# Patient Record
Sex: Male | Born: 1965
Health system: Southern US, Community
[De-identification: ages and names within clinical notes are randomized; demographics above are authoritative.]

## PROBLEM LIST (undated history)

## (undated) DIAGNOSIS — F329 Major depressive disorder, single episode, unspecified: Secondary | ICD-10-CM

## (undated) DIAGNOSIS — F419 Anxiety disorder, unspecified: Secondary | ICD-10-CM

## (undated) DIAGNOSIS — F909 Attention-deficit hyperactivity disorder, unspecified type: Secondary | ICD-10-CM

## (undated) DIAGNOSIS — F32A Depression, unspecified: Secondary | ICD-10-CM

## (undated) DIAGNOSIS — E785 Hyperlipidemia, unspecified: Secondary | ICD-10-CM

## (undated) DIAGNOSIS — B019 Varicella without complication: Secondary | ICD-10-CM

## (undated) HISTORY — DX: Attention-deficit hyperactivity disorder, unspecified type: F90.9

## (undated) HISTORY — DX: Anxiety disorder, unspecified: F41.9

## (undated) HISTORY — DX: Varicella without complication: B01.9

## (undated) HISTORY — PX: WISDOM TOOTH EXTRACTION: SHX21

## (undated) HISTORY — PX: OTHER SURGICAL HISTORY: SHX169

## (undated) HISTORY — DX: Hyperlipidemia, unspecified: E78.5

## (undated) HISTORY — DX: Major depressive disorder, single episode, unspecified: F32.9

## (undated) HISTORY — DX: Depression, unspecified: F32.A

---

## 2004-01-21 ENCOUNTER — Ambulatory Visit: Payer: Self-pay | Admitting: Family Medicine

## 2005-06-06 ENCOUNTER — Ambulatory Visit: Payer: Self-pay | Admitting: Family Medicine

## 2008-10-28 ENCOUNTER — Ambulatory Visit: Payer: Self-pay | Admitting: Family Medicine

## 2008-10-28 DIAGNOSIS — J209 Acute bronchitis, unspecified: Secondary | ICD-10-CM

## 2008-10-28 DIAGNOSIS — E785 Hyperlipidemia, unspecified: Secondary | ICD-10-CM | POA: Insufficient documentation

## 2008-10-28 DIAGNOSIS — F329 Major depressive disorder, single episode, unspecified: Secondary | ICD-10-CM

## 2008-10-28 DIAGNOSIS — F418 Other specified anxiety disorders: Secondary | ICD-10-CM | POA: Insufficient documentation

## 2008-11-02 ENCOUNTER — Telehealth: Payer: Self-pay | Admitting: Family Medicine

## 2008-11-02 LAB — CONVERTED CEMR LAB
Albumin: 4.4 g/dL (ref 3.5–5.2)
Basophils Absolute: 0 10*3/uL (ref 0.0–0.1)
Basophils Relative: 0.1 % (ref 0.0–3.0)
Bilirubin, Direct: 0 mg/dL (ref 0.0–0.3)
CO2: 30 meq/L (ref 19–32)
Calcium: 9.6 mg/dL (ref 8.4–10.5)
Chloride: 103 meq/L (ref 96–112)
Cholesterol: 233 mg/dL — ABNORMAL HIGH (ref 0–200)
Creatinine, Ser: 1.1 mg/dL (ref 0.4–1.5)
Direct LDL: 179.2 mg/dL
Eosinophils Absolute: 0.1 10*3/uL (ref 0.0–0.7)
Glucose, Bld: 90 mg/dL (ref 70–99)
MCHC: 33.7 g/dL (ref 30.0–36.0)
MCV: 86 fL (ref 78.0–100.0)
Monocytes Absolute: 0.6 10*3/uL (ref 0.1–1.0)
Neutro Abs: 4.8 10*3/uL (ref 1.4–7.7)
Neutrophils Relative %: 66.9 % (ref 43.0–77.0)
RBC: 5.28 M/uL (ref 4.22–5.81)
RDW: 13.3 % (ref 11.5–14.6)
Total CHOL/HDL Ratio: 6
Total Protein: 6.9 g/dL (ref 6.0–8.3)
Triglycerides: 64 mg/dL (ref 0.0–149.0)

## 2010-05-14 ENCOUNTER — Other Ambulatory Visit: Payer: Self-pay | Admitting: Family Medicine

## 2011-03-16 ENCOUNTER — Ambulatory Visit (INDEPENDENT_AMBULATORY_CARE_PROVIDER_SITE_OTHER): Payer: BC Managed Care – PPO | Admitting: Family Medicine

## 2011-03-16 ENCOUNTER — Encounter: Payer: Self-pay | Admitting: Family Medicine

## 2011-03-16 VITALS — BP 126/76 | HR 97 | Temp 98.4°F | Wt 164.0 lb

## 2011-03-16 DIAGNOSIS — F418 Other specified anxiety disorders: Secondary | ICD-10-CM

## 2011-03-16 DIAGNOSIS — F341 Dysthymic disorder: Secondary | ICD-10-CM

## 2011-03-16 MED ORDER — ESCITALOPRAM OXALATE 20 MG PO TABS: 20.0000 mg | ORAL_TABLET | Freq: Every day | ORAL | Status: DC

## 2011-03-16 MED ORDER — ALPRAZOLAM 0.5 MG PO TABS: 0.5000 mg | ORAL_TABLET | Freq: Two times a day (BID) | ORAL | Status: DC | PRN

## 2011-03-16 NOTE — Progress Notes (Signed)
  Subjective:    Patient ID: Joseph Davidson, male    DOB: 05-26-1965, 46 y.o.   MRN: [id number]  HPI Here to discuss anxiety and depression. He has been taking Lexapro at a dose of 10 mg a day for the past 2 years. This worked well for [employer], but his life has been a lot more stressful lately. He and his wife have been having problems and they had talked about separating for some time. However several weeks ago he came home from work one day, and she had emptied out the entire house and she had taken the 3 children to live with her mother. He has been very stressed since then. He has hired a Clinical research associate, and he and his wife have embarked on a very angry and bitter fight over money and the kids. He asks if he can take Xanax in addition to the Lexapro. He has used this in the past with good results. He is exercising regularly.   Review of Systems  Constitutional: Negative.   Psychiatric/Behavioral: Positive for sleep disturbance, dysphoric mood, decreased concentration and agitation. Negative for hallucinations, behavioral problems and confusion. The patient is nervous/anxious.        Objective:   Physical Exam  Constitutional: He appears well-developed and well-nourished.  Psychiatric: His behavior is normal. Judgment and thought content normal.       Very anxious but appropriate           Assessment & Plan:  First we will increase the Lexapro to 20 mg a day , then we will add Xanax on an as needed basis. I suggested he see a therapist also, but he is not interested. Recheck at his cpx next month

## 2011-05-02 ENCOUNTER — Other Ambulatory Visit (INDEPENDENT_AMBULATORY_CARE_PROVIDER_SITE_OTHER): Payer: BC Managed Care – PPO

## 2011-05-02 DIAGNOSIS — Z0000 Encounter for general adult medical examination without abnormal findings: Secondary | ICD-10-CM

## 2011-05-02 LAB — LIPID PANEL
HDL: 46.8 mg/dL (ref >39.00)
Total CHOL/HDL Ratio: 5
Triglycerides: 218 mg/dL — ABNORMAL HIGH (ref 0.0–149.0)
VLDL: 43.6 mg/dL — ABNORMAL HIGH (ref 0.0–40.0)

## 2011-05-02 LAB — HEPATIC FUNCTION PANEL
AST: 20 U/L (ref 0–37)
Albumin: 4.4 g/dL (ref 3.5–5.2)
Alkaline Phosphatase: 54 U/L (ref 39–117)
Bilirubin, Direct: 0 mg/dL (ref 0.0–0.3)

## 2011-05-02 LAB — POCT URINALYSIS DIPSTICK
Bilirubin, UA: NEGATIVE
Glucose, UA: NEGATIVE
Ketones, UA: NEGATIVE
Spec Grav, UA: >=1.03
Urobilinogen, UA: 0.2

## 2011-05-02 LAB — CBC WITH DIFFERENTIAL/PLATELET
Basophils Relative: 0.4 % (ref 0.0–3.0)
Eosinophils Relative: 2 % (ref 0.0–5.0)
Hemoglobin: 15.9 g/dL (ref 13.0–17.0)
Lymphocytes Relative: 26.9 % (ref 12.0–46.0)
Monocytes Relative: 5.8 % (ref 3.0–12.0)
Neutro Abs: 4.4 10*3/uL (ref 1.4–7.7)
RBC: 5.21 Mil/uL (ref 4.22–5.81)

## 2011-05-02 LAB — BASIC METABOLIC PANEL
Calcium: 9.7 mg/dL (ref 8.4–10.5)
GFR: 95.23 mL/min (ref >60.00)
Potassium: 4.1 mEq/L (ref 3.5–5.1)
Sodium: 141 mEq/L (ref 135–145)

## 2011-05-02 LAB — LDL CHOLESTEROL, DIRECT: Direct LDL: 148.1 mg/dL

## 2011-05-08 ENCOUNTER — Ambulatory Visit (INDEPENDENT_AMBULATORY_CARE_PROVIDER_SITE_OTHER): Payer: BC Managed Care – PPO | Admitting: Family Medicine

## 2011-05-08 ENCOUNTER — Encounter: Payer: Self-pay | Admitting: Family Medicine

## 2011-05-08 VITALS — BP 116/70 | HR 79 | Temp 98.9°F | Ht 67.0 in | Wt 165.0 lb

## 2011-05-08 DIAGNOSIS — R3129 Other microscopic hematuria: Secondary | ICD-10-CM

## 2011-05-08 DIAGNOSIS — Z0000 Encounter for general adult medical examination without abnormal findings: Secondary | ICD-10-CM

## 2011-05-08 LAB — POCT URINALYSIS DIPSTICK
Nitrite, UA: NEGATIVE
Spec Grav, UA: >=1.03
Urobilinogen, UA: 0.2
pH, UA: 5

## 2011-05-08 MED ORDER — ALPRAZOLAM 0.5 MG PO TABS: 0.5000 mg | ORAL_TABLET | Freq: Two times a day (BID) | ORAL | Status: AC | PRN

## 2011-05-08 NOTE — Progress Notes (Signed)
Addended by: Aniceto Boss A on: 05/08/2011 12:07 PM   Modules accepted: Orders

## 2011-05-08 NOTE — Progress Notes (Signed)
  Subjective:    Patient ID: Joseph Davidson, male    DOB: 12-Apr-1965, 46 y.o.   MRN: [id number]  HPI 46 yr old male for a cpx. He feels well and has no concerns. He tries to watch his diet and he lifts weight. His anxiety is under much better control since we increased his Lexapro, and he uses the Xanax prn.   Review of Systems  Constitutional: Negative.   HENT: Negative.   Eyes: Negative.   Respiratory: Negative.   Cardiovascular: Negative.   Gastrointestinal: Negative.   Genitourinary: Negative.   Musculoskeletal: Negative.   Skin: Negative.   Neurological: Negative.   Hematological: Negative.   Psychiatric/Behavioral: Negative.        Objective:   Physical Exam  Constitutional: He is oriented to person, place, and time. He appears well-developed and well-nourished. No distress.  HENT:  Head: Normocephalic and atraumatic.  Right Ear: External ear normal.  Left Ear: External ear normal.  Nose: Nose normal.  Mouth/Throat: Oropharynx is clear and moist. No oropharyngeal exudate.  Eyes: Conjunctivae and EOM are normal. Pupils are equal, round, and reactive to light. Right eye exhibits no discharge. Left eye exhibits no discharge. No scleral icterus.  Neck: Neck supple. No JVD present. No tracheal deviation present. No thyromegaly present.  Cardiovascular: Normal rate, regular rhythm, normal heart sounds and intact distal pulses.  Exam reveals no gallop and no friction rub.   No murmur heard. Pulmonary/Chest: Effort normal and breath sounds normal. No respiratory distress. He has no wheezes. He has no rales. He exhibits no tenderness.  Abdominal: Soft. Bowel sounds are normal. He exhibits no distension and no mass. There is no tenderness. There is no rebound and no guarding.  Genitourinary: Rectum normal, prostate normal and penis normal. Guaiac negative stool. No penile tenderness.  Musculoskeletal: Normal range of motion. He exhibits no edema and no tenderness.  Lymphadenopathy:     He has no cervical adenopathy.  Neurological: He is alert and oriented to person, place, and time. He has normal reflexes. No cranial nerve deficit. He exhibits normal muscle tone. Coordination normal.  Skin: Skin is warm and dry. No rash noted. He is not diaphoretic. No erythema. No pallor.  Psychiatric: He has a normal mood and affect. His behavior is normal. Judgment and thought content normal.          Assessment & Plan:  Well exam. He will try to reduce the fats in his diet. We will refer to Urology to evaluate the hematuria.

## 2011-08-28 ENCOUNTER — Telehealth: Payer: Self-pay | Admitting: Family Medicine

## 2011-08-28 NOTE — Telephone Encounter (Signed)
Caller: Quintrell/Patient; Patient Name: Joseph Davidson; PCP: Nelwyn Salisbury.; Best Callback Phone Number: [phone].  Cialis prescription.  All emergent symptoms ruled out per Male Sexual Problems Protocol, see in 72 hours.  Advised Patient to call back on 8-14 for an appointment to discuss Cialis script.  Patient verbalized understanding.

## 2011-09-03 ENCOUNTER — Encounter: Payer: Self-pay | Admitting: Family Medicine

## 2011-09-03 ENCOUNTER — Ambulatory Visit (INDEPENDENT_AMBULATORY_CARE_PROVIDER_SITE_OTHER): Payer: BC Managed Care – PPO | Admitting: Family Medicine

## 2011-09-03 VITALS — BP 110/70 | HR 80 | Temp 98.9°F | Wt 172.0 lb

## 2011-09-03 DIAGNOSIS — L0291 Cutaneous abscess, unspecified: Secondary | ICD-10-CM

## 2011-09-03 DIAGNOSIS — L0390 Cellulitis, unspecified: Secondary | ICD-10-CM

## 2011-09-03 DIAGNOSIS — N529 Male erectile dysfunction, unspecified: Secondary | ICD-10-CM

## 2011-09-03 MED ORDER — DOXYCYCLINE HYCLATE 100 MG PO CAPS: 100.0000 mg | ORAL_CAPSULE | Freq: Two times a day (BID) | ORAL | Status: AC

## 2011-09-03 MED ORDER — TADALAFIL 20 MG PO TABS: 20.0000 mg | ORAL_TABLET | Freq: Every day | ORAL | Status: DC | PRN

## 2011-09-03 NOTE — Progress Notes (Signed)
  Subjective:    Patient ID: Joseph Davidson, male    DOB: [birth year], 46 y.o.   MRN: [id number]  HPI Here for 2 things. First he has had a sore spot on the tip of the nose for 2 weeks. Using Neosporin with no effect. Second he asks to try Cialis. He has had some erection problems the past few weeks which is something new for him. He had not been dating for awhile and now he is seeing someone again. He's not sure if this is "mental" or not. He says his anxiety is well controlled.   Review of Systems  Constitutional: Negative.   Genitourinary: Negative.   Skin: Positive for color change.       Objective:   Physical Exam  Constitutional: He appears well-developed and well-nourished.  HENT:       Red tender swollen spot at the tip of the nose  Lymphadenopathy:    He has no cervical adenopathy.  Psychiatric: He has a normal mood and affect. His behavior is normal. Thought content normal.          Assessment & Plan:  Use Doxycycline for the cellulitis, which would cover for a possible staph infection. Try Cialis. He will return prn

## 2011-11-13 ENCOUNTER — Other Ambulatory Visit: Payer: Self-pay | Admitting: Family Medicine

## 2011-11-13 NOTE — Telephone Encounter (Signed)
Refill request for Alprazolam 0.5 mg and last here on 09/03/11.

## 2011-11-14 MED ORDER — ALPRAZOLAM 0.5 MG PO TABS: ORAL_TABLET | ORAL | Status: DC

## 2011-11-14 NOTE — Telephone Encounter (Signed)
Call in #[phone] with 5 rf 

## 2011-11-14 NOTE — Telephone Encounter (Signed)
Called in Rx

## 2012-03-19 ENCOUNTER — Other Ambulatory Visit: Payer: Self-pay | Admitting: Family Medicine

## 2012-04-30 ENCOUNTER — Encounter: Payer: Self-pay | Admitting: Family Medicine

## 2012-04-30 ENCOUNTER — Ambulatory Visit (INDEPENDENT_AMBULATORY_CARE_PROVIDER_SITE_OTHER): Payer: BC Managed Care – PPO | Admitting: Family Medicine

## 2012-04-30 VITALS — BP 100/70 | Temp 97.8°F

## 2012-04-30 DIAGNOSIS — R21 Rash and other nonspecific skin eruption: Secondary | ICD-10-CM

## 2012-04-30 MED ORDER — CLOTRIMAZOLE-BETAMETHASONE 1-0.05 % EX CREA: TOPICAL_CREAM | Freq: Two times a day (BID) | CUTANEOUS | Status: DC

## 2012-04-30 MED ORDER — FLUCONAZOLE 100 MG PO TABS: 100.0000 mg | ORAL_TABLET | Freq: Every day | ORAL | Status: DC

## 2012-04-30 NOTE — Patient Instructions (Addendum)
Leave off all topicals except for medication prescribed Keep area as dry as possible.

## 2012-04-30 NOTE — Progress Notes (Signed)
  Subjective:    Patient ID: Joseph Davidson, male    DOB: 1965/02/26, 47 y.o.   MRN: [id number]  HPI Acute visit. Pruritic skin rash perianal region for several months. Has tried multiple things including over-the-counter hydrocortisone, Vaseline, Preparation H, and Neosporin without improvement. Rash is very pruritic. Nonpainful. No recent diarrhea. No antibiotic use. No recent prednisone use. No alleviating factors. Frequently scratches region because of pruritus  Past Medical History  Diagnosis Date  . Chicken pox   . Depression   . Hyperlipidemia   . Anxiety    Past Surgical History  Procedure Laterality Date  . Benign mole      sees Dr. Elmon Else   . Wisdom tooth extraction      reports that he has never smoked. He has never used smokeless tobacco. He reports that he drinks about 0.5 ounces of alcohol per week. He reports that he does not use illicit drugs. family history includes Cancer in his other; Diabetes in his other; Schizophrenia in his other; and Stroke in his other. Allergies  Allergen Reactions  . Sulfamethoxazole W-Trimethoprim       Review of Systems  Constitutional: Negative for fever and chills.  Gastrointestinal: Negative for diarrhea.  Skin: Positive for rash.       Objective:   Physical Exam  Constitutional: He appears well-developed and well-nourished. No distress.  Cardiovascular: Normal rate and regular rhythm.   Skin: Rash noted.  Patient has erythematous rash with somewhat well demarcated border covering area about 6 x 8 cm with slightly scaly center. No pustules. No vesicles. Nontender          Assessment & Plan:  Pruritic perianal rash. Question fungal component with scaly features above. Lotrisone cream twice daily. Avoid Vaseline, Neosporin, or other over-the-counter topicals   fluconazole 100 mg daily for one week. Followup with primary in 2 weeks if not resolving

## 2012-05-01 ENCOUNTER — Ambulatory Visit: Payer: BC Managed Care – PPO | Admitting: Internal Medicine

## 2012-05-30 ENCOUNTER — Telehealth: Payer: Self-pay | Admitting: Family Medicine

## 2012-05-30 MED ORDER — ALPRAZOLAM 0.5 MG PO TABS: ORAL_TABLET | ORAL | Status: DC

## 2012-05-30 NOTE — Telephone Encounter (Signed)
I called in script 

## 2012-05-30 NOTE — Telephone Encounter (Signed)
Refill request for Alprazolam 0.5 mg 

## 2012-05-30 NOTE — Telephone Encounter (Signed)
Call in #[phone] with 5 rf 

## 2012-06-03 ENCOUNTER — Ambulatory Visit (INDEPENDENT_AMBULATORY_CARE_PROVIDER_SITE_OTHER): Payer: BC Managed Care – PPO | Admitting: Family Medicine

## 2012-06-03 ENCOUNTER — Telehealth: Payer: Self-pay | Admitting: Family Medicine

## 2012-06-03 ENCOUNTER — Encounter: Payer: Self-pay | Admitting: Family Medicine

## 2012-06-03 VITALS — BP 114/76 | HR 72 | Temp 98.4°F | Wt 172.0 lb

## 2012-06-03 DIAGNOSIS — S96911A Strain of unspecified muscle and tendon at ankle and foot level, right foot, initial encounter: Secondary | ICD-10-CM

## 2012-06-03 DIAGNOSIS — S93609A Unspecified sprain of unspecified foot, initial encounter: Secondary | ICD-10-CM

## 2012-06-03 NOTE — Progress Notes (Signed)
  Subjective:    Patient ID: Joseph Davidson, male    DOB: 15-May-1965, 47 y.o.   MRN: [id number]  HPI Here for 3 days of pain along the lateral right foot. No acute trauma but it started hurting this past weekend as he was walking with his girlfriend. They went on a long hike, and he was wearing a old pair of running shoes. The foot did not swell. He took some Advil which helped. It feels a little better today.    Review of Systems  Constitutional: Negative.   Musculoskeletal: Positive for arthralgias and gait problem.       Objective:   Physical Exam  Constitutional: He appears well-developed and well-nourished.  Musculoskeletal:  He is slightly tender along the lateral right foot, no swelling.           Assessment & Plan:  Foot strain, probably of the lateral arch. This should resolve over the next few weeks. He will stay off his feet as much as possible. Use ice and Advil. Advised him to purchase a new pair of running shoes.

## 2012-06-03 NOTE — Telephone Encounter (Signed)
Patient Information:  Caller Name: Thanh  Phone: [phone]  Patient: Joseph Davidson  Gender: Male  DOB: May 08, 1965  Age: 47 Years  PCP: Gershon Crane [facility])  Office Follow Up:  Does the office need to follow up with this patient?: No  Instructions For The Office: N/A   Symptoms  Reason For Call & Symptoms: Micah Flesher for 10 mile hike on 06/01/12 and R foot started hurting toward end of hike and he is limping. No injury but pain with weight bearing. May be slightly swollen and red.   Reviewed Health History In EMR: Yes  Reviewed Medications In EMR: Yes  Reviewed Allergies In EMR: Yes  Reviewed Surgeries / Procedures: Yes  Date of Onset of Symptoms: 06/01/2012  Treatments Tried: Elevating when can and trying not to put weight on foot  Treatments Tried Worked: No  Guideline(s) Used:  Foot Pain  Disposition Per Guideline:   See Today in Office  Reason For Disposition Reached:   Severe pain (e.g., excruciating, unable to do any normal activities)  Advice Given:  Reassurance - Foot Pain  Reassurance - Overuse  It sounds like an overuse injury.  Foot pain and soreness are very common following vigorous activity. Such activities include sports like tennis and basketball, jogging, and certain types of work (lots of walking).  Call Back if:  Severe pain not relieved by pain medication  Pain lasts over 7 days  You become worse.  Patient Will Follow Care Advice:  YES  Appointment Scheduled:  06/03/2012 09:00:00 Appointment Scheduled Provider:  Gershon Crane [facility])

## 2012-09-06 ENCOUNTER — Other Ambulatory Visit: Payer: Self-pay | Admitting: Family Medicine

## 2012-09-06 NOTE — Telephone Encounter (Signed)
Rx last filled 09/03/2011.  Pt last seen 06/03/2012.

## 2012-11-17 ENCOUNTER — Other Ambulatory Visit: Payer: BC Managed Care – PPO

## 2012-11-20 ENCOUNTER — Other Ambulatory Visit (INDEPENDENT_AMBULATORY_CARE_PROVIDER_SITE_OTHER): Payer: BC Managed Care – PPO

## 2012-11-20 DIAGNOSIS — Z0000 Encounter for general adult medical examination without abnormal findings: Secondary | ICD-10-CM

## 2012-11-20 LAB — BASIC METABOLIC PANEL
BUN: 17 mg/dL (ref 6–23)
Calcium: 10 mg/dL (ref 8.4–10.5)
Creatinine, Ser: 1 mg/dL (ref 0.4–1.5)
GFR: 91.12 mL/min (ref >60.00)
Potassium: 4.9 mEq/L (ref 3.5–5.1)

## 2012-11-20 LAB — POCT URINALYSIS DIPSTICK
Bilirubin, UA: NEGATIVE
Ketones, UA: NEGATIVE
Leukocytes, UA: NEGATIVE
Protein, UA: NEGATIVE
Spec Grav, UA: 1.025
Urobilinogen, UA: 0.2
pH, UA: 6

## 2012-11-20 LAB — CBC WITH DIFFERENTIAL/PLATELET
Basophils Absolute: 0 10*3/uL (ref 0.0–0.1)
Eosinophils Relative: 1.8 % (ref 0.0–5.0)
Lymphocytes Relative: 23.9 % (ref 12.0–46.0)
Monocytes Relative: 6.1 % (ref 3.0–12.0)
Neutrophils Relative %: 67.9 % (ref 43.0–77.0)
Platelets: 221 10*3/uL (ref 150.0–400.0)
RDW: 13.1 % (ref 11.5–14.6)
WBC: 6.6 10*3/uL (ref 4.5–10.5)

## 2012-11-20 LAB — HEPATIC FUNCTION PANEL
ALT: 40 U/L (ref 0–53)
AST: 23 U/L (ref 0–37)
Bilirubin, Direct: 0.1 mg/dL (ref 0.0–0.3)
Total Bilirubin: 0.9 mg/dL (ref 0.3–1.2)

## 2012-11-20 LAB — LIPID PANEL
Total CHOL/HDL Ratio: 6
VLDL: 23.6 mg/dL (ref 0.0–40.0)

## 2012-11-24 ENCOUNTER — Telehealth: Payer: Self-pay | Admitting: Family Medicine

## 2012-11-24 ENCOUNTER — Ambulatory Visit (INDEPENDENT_AMBULATORY_CARE_PROVIDER_SITE_OTHER): Payer: BC Managed Care – PPO | Admitting: Family Medicine

## 2012-11-24 ENCOUNTER — Encounter: Payer: Self-pay | Admitting: Family Medicine

## 2012-11-24 VITALS — BP 122/78 | HR 78 | Temp 98.5°F | Ht 67.0 in | Wt 174.0 lb

## 2012-11-24 DIAGNOSIS — Z0000 Encounter for general adult medical examination without abnormal findings: Secondary | ICD-10-CM

## 2012-11-24 MED ORDER — AMPHETAMINE-DEXTROAMPHET ER 20 MG PO CP24: 20.0000 mg | ORAL_CAPSULE | ORAL | Status: DC

## 2012-11-24 MED ORDER — ALPRAZOLAM 0.5 MG PO TABS: 0.5000 mg | ORAL_TABLET | Freq: Two times a day (BID) | ORAL | Status: DC | PRN

## 2012-11-24 NOTE — Telephone Encounter (Signed)
Tell him to go ahead and try the ones I gave him. Let me know how this goes

## 2012-11-24 NOTE — Telephone Encounter (Signed)
Pt was seen this morning.  He went to pick up his Rx, they are the extended release tabs which cannot be broken in half.  Pt states he thought he was supposed to have a 20mg  tablet that could be broken in half.  Please advise.

## 2012-11-24 NOTE — Progress Notes (Signed)
  Subjective:    Patient ID: Joseph Davidson, male    DOB: 08-Mar-1965, 47 y.o.   MRN: [id number]  HPI 47 yr old male for a cpx. He feels well but asks if he can have a rx for Adderall. He has trouble focusing at work sometimes, and he finds that Adderall helps a lot. He got some from a friend recenyl and has taken it sporadically for a month. It works well for him and there are no side effects.    Review of Systems  Constitutional: Negative.   HENT: Negative.   Eyes: Negative.   Respiratory: Negative.   Cardiovascular: Negative.   Gastrointestinal: Negative.   Genitourinary: Negative.   Musculoskeletal: Negative.   Skin: Negative.   Neurological: Negative.   Psychiatric/Behavioral: Positive for decreased concentration. Negative for suicidal ideas, hallucinations, behavioral problems, confusion, sleep disturbance, self-injury, dysphoric mood and agitation. The patient is not nervous/anxious and is not hyperactive.        Objective:   Physical Exam  Constitutional: He is oriented to person, place, and time. He appears well-developed and well-nourished. No distress.  HENT:  Head: Normocephalic and atraumatic.  Right Ear: External ear normal.  Left Ear: External ear normal.  Nose: Nose normal.  Mouth/Throat: Oropharynx is clear and moist. No oropharyngeal exudate.  Eyes: Conjunctivae and EOM are normal. Pupils are equal, round, and reactive to light. Right eye exhibits no discharge. Left eye exhibits no discharge. No scleral icterus.  Neck: Neck supple. No JVD present. No tracheal deviation present. No thyromegaly present.  Cardiovascular: Normal rate, regular rhythm, normal heart sounds and intact distal pulses.  Exam reveals no gallop and no friction rub.   No murmur heard. Pulmonary/Chest: Effort normal and breath sounds normal. No respiratory distress. He has no wheezes. He has no rales. He exhibits no tenderness.  Abdominal: Soft. Bowel sounds are normal. He exhibits no distension  and no mass. There is no tenderness. There is no rebound and no guarding.  Genitourinary: Rectum normal, prostate normal and penis normal. Guaiac negative stool. No penile tenderness.  Musculoskeletal: Normal range of motion. He exhibits no edema and no tenderness.  Lymphadenopathy:    He has no cervical adenopathy.  Neurological: He is alert and oriented to person, place, and time. He has normal reflexes. No cranial nerve deficit. He exhibits normal muscle tone. Coordination normal.  Skin: Skin is warm and dry. No rash noted. He is not diaphoretic. No erythema. No pallor.  Psychiatric: He has a normal mood and affect. His behavior is normal. Judgment and thought content normal.          Assessment & Plan:  Well exam. He will redouble his dietary efforts to get the cholesterol down. I wrote for a supply of Adderall.

## 2012-11-24 NOTE — Telephone Encounter (Signed)
I left a voice message with below information. 

## 2012-11-24 NOTE — Progress Notes (Signed)
Quick Note:  Pt has appointment today will go over then. ______

## 2012-11-27 ENCOUNTER — Telehealth: Payer: Self-pay | Admitting: Family Medicine

## 2012-11-27 NOTE — Telephone Encounter (Signed)
[facility] Triage Call Report Triage Record Num: [id number] Operator: Geanie Berlin Patient Name: Joseph Davidson Call Date & Time: 11/26/2012 5:40:41PM Patient Phone: [phone] PCP: Tera Mater. Clent Ridges Patient Gender: Male PCP Fax : [phone] Patient DOB: [birth year] Practice Name: [facility] Reason for Call: Caller: Adnan/Patient; PCP: Gershon Crane [facility]); CB#: [phone]; Call regarding would like to change Adderal ER 20 mg from extended release capsules to non extended release tablets. Got 30 days supply fo 20 mg Adderall ER on ordered on 11/10. Would like to be able to split the 20 mg tablets in half and use 10 mg instead, due to cost. Informed MD might be limited by insurance requirments to order prescribed dose. RN unable to make change in scheduled drug dose after hours. Advised information will be sent to office for approval by MD. Requests office call back to advise what MD orders. Speak with provider within 24h hours for adult with medication questions regarding Rx medication not covered by available resources per Medication Questions. Office: Please call back 11/27/12. Protocol(s) Used: Medication Questions - Adult Recommended Outcome per Protocol: Speak with Provider or Pharmacist within 24 hours Reason for Outcome: Older adult with questions about prescribed and/or nonprescribed medications not covered by available resources Care Advice: ~

## 2012-11-28 NOTE — Telephone Encounter (Signed)
I left a voice message with below information. 

## 2012-11-28 NOTE — Telephone Encounter (Signed)
We addressed this yesterday. I understand what he is saying and I plan to write for immediate release 20 mg so he can split them from now on, but I suggested he use up the ones he has first. If he does not want to do that, I can write a new rx but he must bring in the other ones so we can destroy them.

## 2012-12-01 ENCOUNTER — Telehealth: Payer: Self-pay | Admitting: Family Medicine

## 2012-12-01 MED ORDER — AMPHETAMINE-DEXTROAMPHETAMINE 20 MG PO TABS: 10.0000 mg | ORAL_TABLET | Freq: Two times a day (BID) | ORAL | Status: DC

## 2012-12-01 NOTE — Telephone Encounter (Signed)
He brought the rx back for Adderall XR and we wrote for immediate release

## 2012-12-09 ENCOUNTER — Encounter: Payer: Self-pay | Admitting: Family Medicine

## 2012-12-09 ENCOUNTER — Telehealth: Payer: Self-pay | Admitting: Family Medicine

## 2012-12-09 NOTE — Telephone Encounter (Signed)
Patient Information:  Caller Name: Zakariah  Phone: [phone]  Patient: Joseph Davidson  Gender: Male  DOB: [birth year]  Age: 47 Years  PCP: Gershon Crane [facility])  Office Follow Up:  Does the office need to follow up with this patient?: No  Instructions For The Office: N/A  RN Note:  Advised caller of office policy.  Appt scheduled for tomorrow at 10;30 with Dr. Clent Ridges. Care advice and call back parameters reviewed.  Symptoms  Reason For Call & Symptoms: Patient request Rx for sinus infection. He states a cold symptoms that have developed into sinus  pressure, congestion, green drainage. Afebrile.  Reviewed Health History In EMR: Yes  Reviewed Medications In EMR: Yes  Reviewed Allergies In EMR: Yes  Reviewed Surgeries / Procedures: Yes  Date of Onset of Symptoms: 11/28/2012  Treatments Tried: Netti pot. humidifer  Treatments Tried Worked: No  Guideline(s) Used:  Sinus Pain and Congestion  Disposition Per Guideline:   See Today or Tomorrow in Office  Reason For Disposition Reached:   Sinus congestion (pressure, fullness) present > 10 days  Advice Given:  For a Stuffy Nose - Use Nasal Washes:  Introduction: Saline (salt water) nasal irrigation (nasal wash) is an effective and simple home remedy for treating stuffy nose and sinus congestion. The nose can be irrigated by pouring, spraying, or squirting salt water into the nose and then letting it run back out.  How it Helps: The salt water rinses out excess mucus, washes out any irritants (dust, allergens) that might be present, and moistens the nasal cavity.  Hydration:  Drink plenty of liquids (6-8 glasses of water daily). If the air in your home is dry, use a cool mist humidifier  Pain and Fever Medicines:  For pain or fever relief, take either acetaminophen or ibuprofen.  They are over-the-counter (OTC) drugs that help treat both fever and pain. You can buy them at the drugstore.  Ibuprofen (e.g., Motrin, Advil):  Another choice is to take 600 mg (three 200 mg pills) by mouth every 8 hours.  Nasal Decongestants for a Very Stuffy or Runny Nose:  If you have a very stuffy nose, nasal decongestant medicines can shrink the swollen nasal mucosa and allow for easier breathing. If you have a very runny nose, these medicines can reduce the amount of drainage. They may be taken as pills by mouth or as a nasal spray.  Most people do Not need to use these medicines. If your nose feels blocked, you should try using nasal washes first.  Pseudoephedrine (Sudafed) is available OTC in pill form. Typical adult dosage is two 30 mg tablets every 6 hours.  Oxymetazoline Nasal Drops (Afrin) are available OTC. Clean out the nose before using. Spray each nostril once, wait one minute for absorption, and then spray a second time.  Call Back If:   Severe pain lasts longer than 2 hours after pain medicine  Sinus pain lasts longer than 1 day after starting treatment using nasal washes  Sinus congestion (fullness) lasts longer than 10 days  Fever lasts longer than 3 days  You become worse.  Patient Will Follow Care Advice:  YES  Appointment Scheduled:  12/10/2012 10:30:00 Appointment Scheduled Provider:  Gershon Crane [facility])

## 2012-12-10 ENCOUNTER — Encounter: Payer: Self-pay | Admitting: Family Medicine

## 2012-12-10 ENCOUNTER — Ambulatory Visit (INDEPENDENT_AMBULATORY_CARE_PROVIDER_SITE_OTHER): Payer: BC Managed Care – PPO | Admitting: Family Medicine

## 2012-12-10 VITALS — BP 120/80 | HR 95 | Temp 98.4°F | Wt 172.0 lb

## 2012-12-10 DIAGNOSIS — J019 Acute sinusitis, unspecified: Secondary | ICD-10-CM

## 2012-12-10 DIAGNOSIS — K6289 Other specified diseases of anus and rectum: Secondary | ICD-10-CM

## 2012-12-10 MED ORDER — AMOXICILLIN-POT CLAVULANATE 875-125 MG PO TABS: 1.0000 | ORAL_TABLET | Freq: Two times a day (BID) | ORAL | Status: DC

## 2012-12-10 MED ORDER — TRIAMCINOLONE ACETONIDE 0.1 % EX CREA: 1.0000 "application " | TOPICAL_CREAM | Freq: Three times a day (TID) | CUTANEOUS | Status: DC

## 2012-12-10 NOTE — Progress Notes (Signed)
Pre visit review using our clinic review tool, if applicable. No additional management support is needed unless otherwise documented below in the visit note. 

## 2012-12-10 NOTE — Progress Notes (Signed)
   Subjective:    Patient ID: Joseph Davidson, male    DOB: 1965-11-16, 47 y.o.   MRN: [id number]  HPI Here for 2 things. First he has periodic irritation around the anus which has not responded to OTC antifungals. Also he has had one week of sinus pressure, PND, and blowing green mucus from the nose.    Review of Systems  Constitutional: Negative.   HENT: Positive for congestion, postnasal drip and sinus pressure.   Eyes: Negative.   Respiratory: Positive for cough.        Objective:   Physical Exam  Constitutional: He appears well-developed and well-nourished.  HENT:  Right Ear: External ear normal.  Left Ear: External ear normal.  Nose: Nose normal.  Mouth/Throat: Oropharynx is clear and moist.  Eyes: Conjunctivae are normal.  Pulmonary/Chest: Effort normal and breath sounds normal.  Genitourinary:  Macular erythema around the anus   Lymphadenopathy:    He has no cervical adenopathy.          Assessment & Plan:  Recheck prn.

## 2013-02-02 ENCOUNTER — Telehealth: Payer: Self-pay | Admitting: Family Medicine

## 2013-02-02 MED ORDER — AMPHETAMINE-DEXTROAMPHETAMINE 20 MG PO TABS: 10.0000 mg | ORAL_TABLET | Freq: Two times a day (BID) | ORAL | Status: DC

## 2013-02-02 NOTE — Telephone Encounter (Signed)
Done for one month only. He needs testing and a contract  

## 2013-02-03 NOTE — Telephone Encounter (Signed)
Script is ready for pick up, contact was printed and I spoke with pt.

## 2013-02-20 ENCOUNTER — Encounter: Payer: Self-pay | Admitting: Family Medicine

## 2013-02-25 ENCOUNTER — Encounter: Payer: Self-pay | Admitting: Family Medicine

## 2013-04-05 ENCOUNTER — Other Ambulatory Visit: Payer: Self-pay | Admitting: Family Medicine

## 2013-07-28 ENCOUNTER — Telehealth: Payer: Self-pay | Admitting: Family Medicine

## 2013-07-29 MED ORDER — LEXAPRO 20 MG PO TABS: ORAL_TABLET | ORAL | Status: DC

## 2013-07-29 MED ORDER — AMPHETAMINE-DEXTROAMPHETAMINE 20 MG PO TABS: 10.0000 mg | ORAL_TABLET | Freq: Two times a day (BID) | ORAL | Status: DC

## 2013-07-29 NOTE — Telephone Encounter (Signed)
done

## 2013-07-29 NOTE — Telephone Encounter (Signed)
Script is ready for pick up and I left a voice message.  

## 2013-08-31 ENCOUNTER — Encounter: Payer: Self-pay | Admitting: Family Medicine

## 2013-09-01 MED ORDER — LEXAPRO 20 MG PO TABS: ORAL_TABLET | ORAL | Status: DC

## 2013-09-01 NOTE — Telephone Encounter (Signed)
Refill his Lexapro for one year using BRAND NAME ONLY

## 2013-09-02 ENCOUNTER — Encounter: Payer: Self-pay | Admitting: Family Medicine

## 2013-09-03 NOTE — Telephone Encounter (Signed)
I recommend he make an OV to come in and discuss his options

## 2013-09-04 NOTE — Telephone Encounter (Signed)
He should make an OV to discuss this

## 2013-09-04 NOTE — Telephone Encounter (Signed)
Note sent to MyChart.

## 2013-09-09 ENCOUNTER — Ambulatory Visit (INDEPENDENT_AMBULATORY_CARE_PROVIDER_SITE_OTHER): Payer: 59 | Admitting: Family Medicine

## 2013-09-09 ENCOUNTER — Encounter: Payer: Self-pay | Admitting: Family Medicine

## 2013-09-09 VITALS — BP 119/75 | HR 68 | Temp 98.8°F | Ht 67.0 in | Wt 176.0 lb

## 2013-09-09 DIAGNOSIS — F329 Major depressive disorder, single episode, unspecified: Secondary | ICD-10-CM

## 2013-09-09 DIAGNOSIS — F3289 Other specified depressive episodes: Secondary | ICD-10-CM

## 2013-09-09 DIAGNOSIS — R6882 Decreased libido: Secondary | ICD-10-CM

## 2013-09-09 DIAGNOSIS — F900 Attention-deficit hyperactivity disorder, predominantly inattentive type: Secondary | ICD-10-CM

## 2013-09-09 DIAGNOSIS — F909 Attention-deficit hyperactivity disorder, unspecified type: Secondary | ICD-10-CM | POA: Insufficient documentation

## 2013-09-09 LAB — TESTOSTERONE: TESTOSTERONE: 443.1 ng/dL (ref 300.00–890.00)

## 2013-09-09 MED ORDER — ADDERALL 20 MG PO TABS: 10.0000 mg | ORAL_TABLET | Freq: Two times a day (BID) | ORAL | Status: DC

## 2013-09-09 MED ORDER — DESVENLAFAXINE SUCCINATE ER 50 MG PO TB24
50.0000 mg | ORAL_TABLET | Freq: Every day | ORAL | Status: DC
Start: 2013-09-09 — End: 2013-10-19

## 2013-09-09 NOTE — Progress Notes (Signed)
Pre visit review using our clinic review tool, if applicable. No additional management support is needed unless otherwise documented below in the visit note. 

## 2013-09-09 NOTE — Progress Notes (Signed)
   Subjective:    Patient ID: Joseph Davidson, male    DOB: 02/23/1965, 48 y.o.   MRN: [id number]  HPI Here to dicuss medications. He has been on Lexapro for several years and he says it no longer helps him. He feels tired and mildy depressed al the time. He has has a lack of libido, and this is causing problems with his girlfriend. He wants to try name brand Adderall. He brings in all his old unfilled prescriptions for generic Adderall.    Review of Systems  Constitutional: Positive for fatigue.  Neurological: Negative.   Psychiatric/Behavioral: Positive for dysphoric mood and decreased concentration. Negative for confusion and agitation. The patient is nervous/anxious.        Objective:   Physical Exam  Constitutional: He is oriented to person, place, and time. He appears well-developed and well-nourished.  Neurological: He is alert and oriented to person, place, and time.  Psychiatric: He has a normal mood and affect. His behavior is normal. Judgment and thought content normal.          Assessment & Plan:  Check a testosterone level. Switch from Lexapro to Pristiq 50 mg daily. Switch to name brand Adderall

## 2013-10-18 ENCOUNTER — Other Ambulatory Visit: Payer: Self-pay | Admitting: Family Medicine

## 2013-10-19 NOTE — Telephone Encounter (Signed)
Refill this for one year  

## 2013-10-20 MED ORDER — DESVENLAFAXINE SUCCINATE ER 50 MG PO TB24: 50.0000 mg | ORAL_TABLET | Freq: Every day | ORAL | Status: DC

## 2013-11-03 ENCOUNTER — Encounter: Payer: Self-pay | Admitting: Family Medicine

## 2013-11-03 ENCOUNTER — Ambulatory Visit (INDEPENDENT_AMBULATORY_CARE_PROVIDER_SITE_OTHER): Payer: 59 | Admitting: Family Medicine

## 2013-11-03 VITALS — BP 114/77 | HR 79 | Temp 98.3°F | Ht 67.0 in | Wt 175.0 lb

## 2013-11-03 DIAGNOSIS — K589 Irritable bowel syndrome without diarrhea: Secondary | ICD-10-CM

## 2013-11-03 MED ORDER — TRIAMCINOLONE ACETONIDE 0.1 % EX CREA: 1.0000 "application " | TOPICAL_CREAM | Freq: Three times a day (TID) | CUTANEOUS | Status: DC

## 2013-11-03 NOTE — Progress Notes (Signed)
   Subjective:    Patient ID: Joseph NettleKevin L Davidson, male    DOB: 11/21/1965, 48 y.o.   MRN: [id number]  HPI Here for 10 days of intermittent dull achy pains in the left flank and LLQ. He tends to be constipated but has had several BMs in the past few days. He feels bloated at times and passes a lot of flatus. He has tried Gas X and it helps a little. No urinary sx. No nausea or fever. He notes he has been under extra stress at work lately.    Review of Systems  Constitutional: Negative.   Gastrointestinal: Positive for abdominal pain, constipation and abdominal distention. Negative for nausea, vomiting, diarrhea, blood in stool, anal bleeding and rectal pain.  Genitourinary: Negative.        Objective:   Physical Exam  Constitutional: He appears well-developed and well-nourished. No distress.  Cardiovascular: Normal rate, regular rhythm, normal heart sounds and intact distal pulses.   Pulmonary/Chest: Effort normal and breath sounds normal.  Abdominal: Soft. Bowel sounds are normal. He exhibits no distension and no mass. There is no rebound and no guarding.  Mildly tender in the left flank          Assessment & Plan:  This sounds like IBS. I suggested he reduce his caffeine intake, try Miralax daily as well as a probiotic like Align daily. Recheck prn

## 2013-11-03 NOTE — Progress Notes (Signed)
Pre visit review using our clinic review tool, if applicable. No additional management support is needed unless otherwise documented below in the visit note. 

## 2014-01-17 ENCOUNTER — Encounter: Payer: Self-pay | Admitting: Family Medicine

## 2014-01-18 MED ORDER — ALPRAZOLAM 0.5 MG PO TABS: 0.5000 mg | ORAL_TABLET | Freq: Two times a day (BID) | ORAL | Status: DC | PRN

## 2014-01-18 MED ORDER — ADDERALL 20 MG PO TABS: 10.0000 mg | ORAL_TABLET | Freq: Two times a day (BID) | ORAL | Status: DC

## 2014-01-18 MED ORDER — LEXAPRO 20 MG PO TABS: 20.0000 mg | ORAL_TABLET | Freq: Every day | ORAL | Status: DC

## 2014-01-18 MED ORDER — CIALIS 20 MG PO TABS: 20.0000 mg | ORAL_TABLET | Freq: Every day | ORAL | Status: DC | PRN

## 2014-01-18 NOTE — Telephone Encounter (Signed)
done

## 2014-01-25 ENCOUNTER — Telehealth: Payer: Self-pay | Admitting: Family Medicine

## 2014-01-25 NOTE — Telephone Encounter (Signed)
Quantity limit for Cialis 20 mg is 4.  Can you please re-send the rx on Cialis 20 mg for a qty of 4? thanks

## 2014-01-26 MED ORDER — CIALIS 20 MG PO TABS: 20.0000 mg | ORAL_TABLET | Freq: Every day | ORAL | Status: DC | PRN

## 2014-01-26 NOTE — Telephone Encounter (Signed)
Per Dr. Clent RidgesFry okay to resend and I did change quantity, sent e-scribe.

## 2014-01-28 ENCOUNTER — Encounter: Payer: Self-pay | Admitting: Family Medicine

## 2014-01-29 NOTE — Telephone Encounter (Signed)
Can you check into this? 

## 2014-01-29 NOTE — Telephone Encounter (Signed)
I hadn't received anything requesting a PA, so I called the pharmacy about the below request.  PA has been submitted ref# [id number].

## 2014-02-01 NOTE — Telephone Encounter (Signed)
PA was denied.  Viagra is the preferred medication.

## 2014-02-01 NOTE — Telephone Encounter (Signed)
I sent script for this in last week to [facility] and notified pt. Advised pt to check the price since we changed the quantity.

## 2014-02-01 NOTE — Telephone Encounter (Signed)
Pt requesting a script for Tadalafil ( print )

## 2014-02-01 NOTE — Telephone Encounter (Signed)
I submitted the PA for Cialis and it was denied.  Viagra is the preferred for patient's plan. The representative I spoke to before didn't tell me viagra was preferred, she just stated the quantity limit was 4 on Cialis.

## 2014-02-03 MED ORDER — CIALIS 20 MG PO TABS: 20.0000 mg | ORAL_TABLET | Freq: Every day | ORAL | Status: DC | PRN

## 2014-02-03 NOTE — Telephone Encounter (Signed)
I printed out another rx for the Cialis. Using a [place] pharmacy would be a good idea

## 2014-07-23 ENCOUNTER — Telehealth: Payer: Self-pay | Admitting: Family Medicine

## 2014-07-28 MED ORDER — ADDERALL 20 MG PO TABS: 10.0000 mg | ORAL_TABLET | Freq: Two times a day (BID) | ORAL | Status: DC

## 2014-07-28 NOTE — Addendum Note (Signed)
Addended by: Gershon CraneFRY, Haylea Schlichting A on: 07/28/2014 08:43 AM   Modules accepted: Orders

## 2014-07-28 NOTE — Telephone Encounter (Signed)
done

## 2014-07-28 NOTE — Telephone Encounter (Signed)
I sent pt a my chart message, script is ready for pick up here at front office.

## 2015-02-03 ENCOUNTER — Other Ambulatory Visit: Payer: Self-pay | Admitting: Family Medicine

## 2015-02-04 NOTE — Telephone Encounter (Signed)
He will need an OV for this (he has not been seen in a year and a half)

## 2015-03-11 ENCOUNTER — Ambulatory Visit (INDEPENDENT_AMBULATORY_CARE_PROVIDER_SITE_OTHER): Payer: BLUE CROSS/BLUE SHIELD | Admitting: Family Medicine

## 2015-03-11 ENCOUNTER — Encounter: Payer: Self-pay | Admitting: Family Medicine

## 2015-03-11 VITALS — BP 98/68 | HR 80 | Temp 98.8°F | Ht 67.0 in | Wt 173.0 lb

## 2015-03-11 DIAGNOSIS — R109 Unspecified abdominal pain: Secondary | ICD-10-CM | POA: Diagnosis not present

## 2015-03-11 DIAGNOSIS — R319 Hematuria, unspecified: Secondary | ICD-10-CM | POA: Diagnosis not present

## 2015-03-11 LAB — POC URINALSYSI DIPSTICK (AUTOMATED)
BILIRUBIN UA: NEGATIVE
GLUCOSE UA: NEGATIVE
KETONES UA: NEGATIVE
LEUKOCYTES UA: NEGATIVE
NITRITE UA: NEGATIVE
PH UA: 8
Spec Grav, UA: 1.01
Urobilinogen, UA: 0.2

## 2015-03-11 MED ORDER — LEXAPRO 20 MG PO TABS: 20.0000 mg | ORAL_TABLET | Freq: Every day | ORAL | Status: DC

## 2015-03-11 MED ORDER — ALPRAZOLAM 0.5 MG PO TABS: 0.5000 mg | ORAL_TABLET | Freq: Two times a day (BID) | ORAL | Status: DC | PRN

## 2015-03-11 MED ORDER — ADDERALL 20 MG PO TABS: 10.0000 mg | ORAL_TABLET | Freq: Two times a day (BID) | ORAL | Status: DC

## 2015-03-11 NOTE — Addendum Note (Signed)
Addended by: Aniceto Boss A on: 03/11/2015 11:20 AM   Modules accepted: Orders

## 2015-03-11 NOTE — Progress Notes (Signed)
   Subjective:    Patient ID: Joseph Davidson, male    DOB: Oct 28, 1965, 50 y.o.   MRN: [id number]  HPI Here for 3 weeks of intermittent dull aching pains in the right middle back and right flank. No abdominal problems. No change in urinary or bowel habits. No fever or nausea. Eating food does not affect the pain. Exercising does not affect the pain. Appetite is normal but he does remark on the lack of libido he has experienced these last 2 weeks. Using Cialis for erections.    Review of Systems  Constitutional: Negative.   Respiratory: Negative.   Cardiovascular: Negative.   Gastrointestinal: Negative.   Genitourinary: Positive for flank pain. Negative for dysuria, urgency, frequency, hematuria, discharge and testicular pain.  Musculoskeletal: Negative.        Objective:   Physical Exam  Constitutional: He appears well-developed and well-nourished.  Cardiovascular: Normal rate, regular rhythm, normal heart sounds and intact distal pulses.   Pulmonary/Chest: Effort normal and breath sounds normal.  Abdominal: Soft. Bowel sounds are normal. He exhibits no distension and no mass. There is no rebound and no guarding.  Mildly tender in the right flank and RLQ.           Assessment & Plan:  Hematuria with right flank pain, possible kidney stone or UTI. Set up a renal US and culture the sample. He will drink plenty of water and use Advil prn.

## 2015-03-11 NOTE — Progress Notes (Signed)
Pre visit review using our clinic review tool, if applicable. No additional management support is needed unless otherwise documented below in the visit note. 

## 2015-03-13 LAB — URINE CULTURE
Colony Count: NO GROWTH
Organism ID, Bacteria: NO GROWTH

## 2015-03-14 ENCOUNTER — Ambulatory Visit
Admission: RE | Admit: 2015-03-14 | Discharge: 2015-03-14 | Disposition: A | Discharge status: Home / Self Care | Payer: BLUE CROSS/BLUE SHIELD | Source: Ambulatory Visit | Attending: Family Medicine | Admitting: Family Medicine

## 2015-03-14 DIAGNOSIS — R109 Unspecified abdominal pain: Secondary | ICD-10-CM

## 2015-03-14 DIAGNOSIS — R319 Hematuria, unspecified: Secondary | ICD-10-CM

## 2015-03-16 ENCOUNTER — Encounter: Payer: Self-pay | Admitting: Family Medicine

## 2015-03-18 ENCOUNTER — Ambulatory Visit (INDEPENDENT_AMBULATORY_CARE_PROVIDER_SITE_OTHER): Payer: BLUE CROSS/BLUE SHIELD | Admitting: Family Medicine

## 2015-03-18 ENCOUNTER — Encounter: Payer: Self-pay | Admitting: Family Medicine

## 2015-03-18 VITALS — BP 114/80 | HR 75 | Temp 98.6°F | Ht 67.0 in | Wt 174.0 lb

## 2015-03-18 DIAGNOSIS — E785 Hyperlipidemia, unspecified: Secondary | ICD-10-CM

## 2015-03-18 DIAGNOSIS — R109 Unspecified abdominal pain: Secondary | ICD-10-CM | POA: Diagnosis not present

## 2015-03-18 DIAGNOSIS — N401 Enlarged prostate with lower urinary tract symptoms: Secondary | ICD-10-CM | POA: Diagnosis not present

## 2015-03-18 DIAGNOSIS — N138 Other obstructive and reflux uropathy: Secondary | ICD-10-CM

## 2015-03-18 LAB — BASIC METABOLIC PANEL
BUN: 16 mg/dL (ref 6–23)
CALCIUM: 10 mg/dL (ref 8.4–10.5)
CO2: 30 meq/L (ref 19–32)
Chloride: 102 mEq/L (ref 96–112)
Creatinine, Ser: 0.98 mg/dL (ref 0.40–1.50)
GFR: 87.01 mL/min (ref >60.00)
GLUCOSE: 89 mg/dL (ref 70–99)
POTASSIUM: 4.3 meq/L (ref 3.5–5.1)
SODIUM: 141 meq/L (ref 135–145)

## 2015-03-18 LAB — CBC WITH DIFFERENTIAL/PLATELET
BASOS ABS: 0 10*3/uL (ref 0.0–0.1)
BASOS PCT: 0.3 % (ref 0.0–3.0)
EOS ABS: 0.1 10*3/uL (ref 0.0–0.7)
Eosinophils Relative: 1.4 % (ref 0.0–5.0)
HEMATOCRIT: 46.5 % (ref 39.0–52.0)
HEMOGLOBIN: 16.2 g/dL (ref 13.0–17.0)
LYMPHS PCT: 24.3 % (ref 12.0–46.0)
Lymphs Abs: 1.7 10*3/uL (ref 0.7–4.0)
MCHC: 34.8 g/dL (ref 30.0–36.0)
MCV: 85.9 fl (ref 78.0–100.0)
MONOS PCT: 4.9 % (ref 3.0–12.0)
Monocytes Absolute: 0.3 10*3/uL (ref 0.1–1.0)
Neutro Abs: 4.9 10*3/uL (ref 1.4–7.7)
Neutrophils Relative %: 69.1 % (ref 43.0–77.0)
Platelets: 255 10*3/uL (ref 150.0–400.0)
RBC: 5.42 Mil/uL (ref 4.22–5.81)
RDW: 13.2 % (ref 11.5–15.5)
WBC: 7.1 10*3/uL (ref 4.0–10.5)

## 2015-03-18 LAB — HEPATIC FUNCTION PANEL
ALBUMIN: 4.7 g/dL (ref 3.5–5.2)
ALK PHOS: 63 U/L (ref 39–117)
ALT: 22 U/L (ref 0–53)
AST: 20 U/L (ref 0–37)
BILIRUBIN DIRECT: 0.1 mg/dL (ref 0.0–0.3)
TOTAL PROTEIN: 7 g/dL (ref 6.0–8.3)
Total Bilirubin: 0.6 mg/dL (ref 0.2–1.2)

## 2015-03-18 LAB — TSH: TSH: 1.54 u[IU]/mL (ref 0.35–4.50)

## 2015-03-18 LAB — LIPID PANEL
CHOLESTEROL: 263 mg/dL — AB (ref 0–200)
HDL: 49.9 mg/dL (ref >39.00)
LDL Cholesterol: 186 mg/dL — ABNORMAL HIGH (ref 0–99)
NONHDL: 213.05
Total CHOL/HDL Ratio: 5
Triglycerides: 136 mg/dL (ref 0.0–149.0)
VLDL: 27.2 mg/dL (ref 0.0–40.0)

## 2015-03-18 LAB — POC URINALSYSI DIPSTICK (AUTOMATED)
BILIRUBIN UA: NEGATIVE
GLUCOSE UA: NEGATIVE
KETONES UA: NEGATIVE
Leukocytes, UA: NEGATIVE
NITRITE UA: NEGATIVE
PH UA: 7
Protein, UA: NEGATIVE
RBC UA: NEGATIVE
Spec Grav, UA: 1.015
Urobilinogen, UA: 0.2

## 2015-03-18 LAB — PSA: PSA: 1.14 ng/mL (ref 0.10–4.00)

## 2015-03-18 MED ORDER — TRIAMCINOLONE ACETONIDE 0.1 % EX CREA: 1.0000 "application " | TOPICAL_CREAM | Freq: Three times a day (TID) | CUTANEOUS | Status: DC

## 2015-03-18 NOTE — Progress Notes (Signed)
Pre visit review using our clinic review tool, if applicable. No additional management support is needed unless otherwise documented below in the visit note. 

## 2015-03-18 NOTE — Progress Notes (Signed)
   Subjective:    Patient ID: Joseph NettleKevin L Davidson, male    DOB: 11/06/1965, 50 y.o.   MRN: [id number]  HPI Here to follow up on right sided back and right flank pain which started one month ago. This is a mild dull aching pain tat comes and goes. He can make the pain worse by twisting his trunk to the right side. No change in urinations or BMs. No fever or nausea. After our last visit his urine culture was negative and his renal US was clear.    Review of Systems  Constitutional: Negative.   Respiratory: Negative.   Cardiovascular: Negative.   Gastrointestinal: Negative.   Genitourinary: Positive for flank pain. Negative for dysuria, urgency, frequency and hematuria.       Objective:   Physical Exam  Constitutional: He is oriented to person, place, and time. He appears well-developed and well-nourished. No distress.  Cardiovascular: Normal rate, regular rhythm, normal heart sounds and intact distal pulses.   Pulmonary/Chest: Effort normal and breath sounds normal.  Abdominal: Soft. Bowel sounds are normal. He exhibits no distension and no mass. There is no tenderness. There is no rebound and no guarding.  Neurological: He is alert and oriented to person, place, and time.          Assessment & Plan:  Right flank pain which is most likely muscular in origin. We will send him for more labs today and set him up for an abdominal and pelvic CT.

## 2015-03-23 ENCOUNTER — Ambulatory Visit (INDEPENDENT_AMBULATORY_CARE_PROVIDER_SITE_OTHER)
Admission: RE | Admit: 2015-03-23 | Discharge: 2015-03-23 | Disposition: A | Discharge status: Home / Self Care | Payer: BLUE CROSS/BLUE SHIELD | Source: Ambulatory Visit | Attending: Family Medicine | Admitting: Family Medicine

## 2015-03-23 DIAGNOSIS — R109 Unspecified abdominal pain: Secondary | ICD-10-CM

## 2015-03-23 MED ORDER — IOHEXOL 300 MG/ML  SOLN
100.0000 mL | Freq: Once | INTRAMUSCULAR | Status: AC | PRN
  Administered 2015-03-23: 100 mL via INTRAVENOUS

## 2015-05-05 ENCOUNTER — Other Ambulatory Visit: Payer: Self-pay

## 2015-05-09 ENCOUNTER — Encounter: Payer: Self-pay | Admitting: Family Medicine

## 2015-06-01 DIAGNOSIS — D2261 Melanocytic nevi of right upper limb, including shoulder: Secondary | ICD-10-CM | POA: Diagnosis not present

## 2015-06-01 DIAGNOSIS — D485 Neoplasm of uncertain behavior of skin: Secondary | ICD-10-CM | POA: Diagnosis not present

## 2015-06-01 DIAGNOSIS — D225 Melanocytic nevi of trunk: Secondary | ICD-10-CM | POA: Diagnosis not present

## 2015-06-01 DIAGNOSIS — D239 Other benign neoplasm of skin, unspecified: Secondary | ICD-10-CM | POA: Diagnosis not present

## 2015-06-01 DIAGNOSIS — Z86018 Personal history of other benign neoplasm: Secondary | ICD-10-CM | POA: Diagnosis not present

## 2015-06-01 DIAGNOSIS — D2271 Melanocytic nevi of right lower limb, including hip: Secondary | ICD-10-CM | POA: Diagnosis not present

## 2015-06-09 ENCOUNTER — Ambulatory Visit (INDEPENDENT_AMBULATORY_CARE_PROVIDER_SITE_OTHER): Payer: BLUE CROSS/BLUE SHIELD | Admitting: Family Medicine

## 2015-06-09 ENCOUNTER — Encounter: Payer: Self-pay | Admitting: Family Medicine

## 2015-06-09 VITALS — BP 102/78 | HR 80 | Temp 98.0°F | Ht 67.0 in | Wt 172.3 lb

## 2015-06-09 DIAGNOSIS — J029 Acute pharyngitis, unspecified: Secondary | ICD-10-CM | POA: Diagnosis not present

## 2015-06-09 LAB — POCT RAPID STREP A (OFFICE): Rapid Strep A Screen: NEGATIVE

## 2015-06-09 NOTE — Patient Instructions (Signed)
BEFORE YOU LEAVE: -strep testing  We will contact you if strep testing is positive.  Please follow up as needed.

## 2015-06-09 NOTE — Progress Notes (Signed)
Pre visit review using our clinic review tool, if applicable. No additional management support is needed unless otherwise documented below in the visit note. 

## 2015-06-09 NOTE — Progress Notes (Signed)
HPI:  Acute visit for sore throat and congestion: -started: 2 days ago -symptoms:nasal congestion, sore throat, cough - mild -denies:fever, SOB, NVD, tooth pain -has tried: nothing -sick contacts/travel/risks: son with strep on abx -Hx of: allergies -reports symptoms are mild but because of strep exposure wanted to do strep test  ROS: See pertinent positives and negatives per HPI.  Past Medical History  Diagnosis Date  . Chicken pox   . Depression   . Hyperlipidemia   . Anxiety   . ADHD (attention deficit hyperactivity disorder)     Past Surgical History  Procedure Laterality Date  . Benign mole      sees Dr. Elmon ElseKaren Gould   . Wisdom tooth extraction      Family History  Problem Relation Age of Onset  . Schizophrenia Other   . Diabetes Other   . Cancer Other     prostate  . Stroke Other     Social History   Social History  . Marital Status: Married    Spouse Name: N/A  . Number of Children: N/A  . Years of Education: N/A   Social History Main Topics  . Smoking status: Never Smoker   . Smokeless tobacco: Never Used  . Alcohol Use: 0.6 oz/week    1 Standard drinks or equivalent per week     Comment: occ  . Drug Use: No  . Sexual Activity: Not Asked   Other Topics Concern  . None   Social History Narrative     Current outpatient prescriptions:  .  ADDERALL 20 MG tablet, Take 0.5 tablets (10 mg total) by mouth 2 (two) times daily. As needed, Disp: 30 tablet, Rfl: 0 .  ALPRAZolam (XANAX) 0.5 MG tablet, Take 1 tablet (0.5 mg total) by mouth 2 (two) times daily as needed for anxiety., Disp: 60 tablet, Rfl: 5 .  KRILL OIL PO, Take by mouth daily., Disp: , Rfl:  .  LEXAPRO 20 MG tablet, Take 1 tablet (20 mg total) by mouth daily., Disp: 30 tablet, Rfl: 11 .  Multiple Vitamin (MULTIVITAMIN) tablet, Take 1 tablet by mouth daily., Disp: , Rfl:  .  triamcinolone cream (KENALOG) 0.1 %, Apply 1 application topically 3 (three) times daily., Disp: 30 g, Rfl:  5  EXAM:  Filed Vitals:   06/09/15 1656  BP: 102/78  Pulse: 80  Temp: 98 F (36.7 C)    Body mass index is 26.98 kg/(m^2).  GENERAL: vitals reviewed and listed above, alert, oriented, appears well hydrated and in no acute distress  HEENT: atraumatic, conjunttiva clear, no obvious abnormalities on inspection of external nose and ears, normal appearance of ear canals and TMs, clear nasal congestion, mild post oropharyngeal erythema with PND, no tonsillar edema or exudate, no sinus TTP  NECK: no obvious masses on inspection  LUNGS: clear to auscultation bilaterally, no wheezes, rales or rhonchi, good air movement  CV: HRRR, no peripheral edema  MS: moves all extremities without noticeable abnormality  PSYCH: pleasant and cooperative, no obvious depression or anxiety  ASSESSMENT AND PLAN:  Discussed the following assessment and plan:  No diagnosis found.  -given HPI and exam findings today, a serious infection or illness is unlikely. We discussed potential etiologies, with VURI being most likely, and advised supportive care and monitoring. Rapid strep negative. Strep culture pending and treat if positive.  -of course, we advised to return or notify a doctor immediately if symptoms worsen or persist or new concerns arise.    Patient Instructions  BEFORE YOU LEAVE: -strep testing  We will contact you if strep testing is positive.  Please follow up as needed.       Kriste Basque R.

## 2015-06-11 LAB — CULTURE, GROUP A STREP: Organism ID, Bacteria: NORMAL

## 2015-08-23 ENCOUNTER — Other Ambulatory Visit: Payer: Self-pay | Admitting: Family Medicine

## 2015-08-24 MED ORDER — ADDERALL 20 MG PO TABS: 10.0000 mg | ORAL_TABLET | Freq: Two times a day (BID) | ORAL | 0 refills | Status: DC

## 2015-08-24 NOTE — Telephone Encounter (Signed)
done

## 2015-08-24 NOTE — Telephone Encounter (Signed)
Script is ready for pick up here at front office and I sent pt a my chart message with this information.  

## 2015-11-07 ENCOUNTER — Encounter: Payer: Self-pay | Admitting: Family Medicine

## 2015-11-07 DIAGNOSIS — Z209 Contact with and (suspected) exposure to unspecified communicable disease: Secondary | ICD-10-CM

## 2015-11-08 NOTE — Telephone Encounter (Signed)
Future orders are in place, he can set up a lab appt (no OV needed)

## 2015-11-09 ENCOUNTER — Other Ambulatory Visit (INDEPENDENT_AMBULATORY_CARE_PROVIDER_SITE_OTHER): Payer: BLUE CROSS/BLUE SHIELD

## 2015-11-09 DIAGNOSIS — Z209 Contact with and (suspected) exposure to unspecified communicable disease: Secondary | ICD-10-CM

## 2015-11-09 NOTE — Telephone Encounter (Signed)
Pt scheduled  

## 2015-11-09 NOTE — Telephone Encounter (Signed)
Can you call pt to schedule lab appointment? 

## 2015-11-10 LAB — RPR

## 2015-11-10 LAB — GC/CHLAMYDIA PROBE AMP
CT Probe RNA: NOT DETECTED
GC Probe RNA: NOT DETECTED

## 2015-11-10 LAB — HSV(HERPES SIMPLEX VRS) I + II AB-IGG
HSV 1 Glycoprotein G Ab, IgG: <0.9 Index (ref <0.90)
HSV 2 Glycoprotein G Ab, IgG: <0.9 Index (ref <0.90)

## 2015-11-10 LAB — HIV ANTIBODY (ROUTINE TESTING W REFLEX): HIV: NONREACTIVE

## 2015-11-16 ENCOUNTER — Other Ambulatory Visit: Payer: Self-pay

## 2015-11-16 MED ORDER — LEXAPRO 20 MG PO TABS: 20.0000 mg | ORAL_TABLET | Freq: Every day | ORAL | 0 refills | Status: DC

## 2015-11-24 ENCOUNTER — Other Ambulatory Visit: Payer: Self-pay | Admitting: Family Medicine

## 2015-11-30 MED ORDER — ADDERALL 20 MG PO TABS: 10.0000 mg | ORAL_TABLET | Freq: Two times a day (BID) | ORAL | 0 refills | Status: DC

## 2015-11-30 NOTE — Telephone Encounter (Signed)
Script is ready for pick up here at front office and I left a voice message for pt with this information.  

## 2015-11-30 NOTE — Telephone Encounter (Signed)
done

## 2015-12-16 ENCOUNTER — Telehealth: Payer: Self-pay

## 2015-12-16 NOTE — Telephone Encounter (Signed)
Received PA request from [facility] for Adderall 20 mg tablets. PA submitted & is pending. Key: [id number]

## 2015-12-16 NOTE — Telephone Encounter (Signed)
PA approved, form faxed back to pharmacy. 

## 2016-01-19 DIAGNOSIS — Z23 Encounter for immunization: Secondary | ICD-10-CM | POA: Diagnosis not present

## 2016-05-03 ENCOUNTER — Encounter: Payer: Self-pay | Admitting: Family Medicine

## 2016-05-08 ENCOUNTER — Other Ambulatory Visit: Payer: Self-pay | Admitting: Family Medicine

## 2016-05-08 MED ORDER — TADALAFIL 20 MG PO TABS: 20.0000 mg | ORAL_TABLET | Freq: Every day | ORAL | 11 refills | Status: DC | PRN

## 2016-05-08 NOTE — Telephone Encounter (Signed)
Call in #10 with 11 rf 

## 2016-05-10 ENCOUNTER — Telehealth: Payer: Self-pay

## 2016-05-10 NOTE — Telephone Encounter (Signed)
Received PA request from [facility] for Cialis 20 mg tablets. PA submitted & is pending. Key: [id number]

## 2016-05-15 NOTE — Telephone Encounter (Signed)
PA denied, Viagra has to be tried first.

## 2016-05-15 NOTE — Telephone Encounter (Signed)
Cancel the Cialis. Call in Viagra 100 mg to use prn, #10 with 5 rf

## 2016-05-16 NOTE — Telephone Encounter (Signed)
I left a voice message for pt to return my call. We need to know if pt wants [place] to send in new script and what pharmacy?

## 2016-05-21 ENCOUNTER — Encounter: Payer: Self-pay | Admitting: Family Medicine

## 2016-05-21 NOTE — Telephone Encounter (Signed)
I left another voice message, if pt decides to make the below change he will need to contact our office.

## 2016-06-05 DIAGNOSIS — D225 Melanocytic nevi of trunk: Secondary | ICD-10-CM | POA: Diagnosis not present

## 2016-06-05 DIAGNOSIS — Z86018 Personal history of other benign neoplasm: Secondary | ICD-10-CM | POA: Diagnosis not present

## 2016-06-05 DIAGNOSIS — D2271 Melanocytic nevi of right lower limb, including hip: Secondary | ICD-10-CM | POA: Diagnosis not present

## 2016-06-05 DIAGNOSIS — L821 Other seborrheic keratosis: Secondary | ICD-10-CM | POA: Diagnosis not present

## 2016-06-18 ENCOUNTER — Other Ambulatory Visit: Payer: Self-pay | Admitting: Family Medicine

## 2016-06-18 MED ORDER — LEXAPRO 20 MG PO TABS: 20.0000 mg | ORAL_TABLET | Freq: Every day | ORAL | 0 refills | Status: DC

## 2016-06-18 MED ORDER — ADDERALL 20 MG PO TABS: 10.0000 mg | ORAL_TABLET | Freq: Two times a day (BID) | ORAL | 0 refills | Status: DC

## 2016-06-18 NOTE — Telephone Encounter (Signed)
I refilled Lexapro for 90 days only. Wrote for 30 Adderall. He needs an OV soon

## 2016-06-27 ENCOUNTER — Encounter: Payer: Self-pay | Admitting: Family Medicine

## 2016-06-27 NOTE — Telephone Encounter (Signed)
We already wrote this on 06-18-16

## 2016-07-16 ENCOUNTER — Encounter: Payer: Self-pay | Admitting: Family Medicine

## 2016-07-17 ENCOUNTER — Other Ambulatory Visit: Payer: Self-pay

## 2016-07-17 MED ORDER — TRIAMCINOLONE ACETONIDE 0.1 % EX CREA: 1.0000 "application " | TOPICAL_CREAM | Freq: Three times a day (TID) | CUTANEOUS | 5 refills | Status: DC

## 2016-07-17 NOTE — Telephone Encounter (Signed)
Can we refill this? 

## 2016-07-17 NOTE — Telephone Encounter (Signed)
Call in Triamcinolone cream 45 grams with 5 rf

## 2016-08-30 ENCOUNTER — Ambulatory Visit (INDEPENDENT_AMBULATORY_CARE_PROVIDER_SITE_OTHER): Payer: BLUE CROSS/BLUE SHIELD | Admitting: Family Medicine

## 2016-08-30 ENCOUNTER — Encounter: Payer: Self-pay | Admitting: Family Medicine

## 2016-08-30 VITALS — BP 120/88 | HR 66 | Temp 98.4°F | Ht 67.0 in | Wt 176.0 lb

## 2016-08-30 DIAGNOSIS — R310 Gross hematuria: Secondary | ICD-10-CM

## 2016-08-30 LAB — POC URINALSYSI DIPSTICK (AUTOMATED)
BILIRUBIN UA: NEGATIVE
GLUCOSE UA: NEGATIVE
KETONES UA: NEGATIVE
LEUKOCYTES UA: NEGATIVE
NITRITE UA: NEGATIVE
PH UA: 6 (ref 5.0–8.0)
Protein, UA: NEGATIVE
Spec Grav, UA: >=1.03 — AB (ref 1.010–1.025)
Urobilinogen, UA: 0.2 E.U./dL

## 2016-08-30 NOTE — Patient Instructions (Signed)
WE NOW OFFER   [facility] FAST TRACK!!!  SAME DAY Appointments for ACUTE CARE  Such as: Sprains, Injuries, cuts, abrasions, rashes, muscle pain, joint pain, back pain Colds, flu, sore throats, headache, allergies, cough, fever  Ear pain, sinus and eye infections Abdominal pain, nausea, vomiting, diarrhea, upset stomach Animal/insect bites  3 Easy Ways to Schedule: Walk-In Scheduling Call in scheduling Mychart Sign-up: [email or web address]         

## 2016-08-30 NOTE — Progress Notes (Signed)
   Subjective:    Patient ID: Joseph NettleKevin L Grizzell, male    DOB: 10/11/1965, 51 y.o.   MRN: [id number]  HPI Here for an episode of visible blood in the urine 2 nights ago. Now he cannot see it anymore. No symptoms at all. He was worked up for this in 2013 with an US and a CT scan, but no source was found. He has never had a cystoscopy however.    Review of Systems  Constitutional: Negative.   Respiratory: Negative.   Cardiovascular: Negative.   Gastrointestinal: Negative.   Genitourinary: Positive for hematuria. Negative for dysuria, flank pain, frequency and urgency.       Objective:   Physical Exam  Constitutional: He appears well-developed and well-nourished.  Cardiovascular: Normal rate, regular rhythm, normal heart sounds and intact distal pulses.   Pulmonary/Chest: Effort normal and breath sounds normal. No respiratory distress. He has no wheezes. He has no rales.  Abdominal: Soft. Bowel sounds are normal. He exhibits no distension and no mass. There is no tenderness. There is no rebound and no guarding.  Genitourinary: Prostate normal.  Genitourinary Comments: No testicular tenderness           Assessment & Plan:  Hematuria. We will culture the sample. Refer back to Urology. Gershon CraneStephen Fry, MD

## 2016-08-30 NOTE — Addendum Note (Signed)
Addended by: Aniceto BossNIMMONS, SYLVIA A on: 08/30/2016 09:49 AM   Modules accepted: Orders

## 2016-08-31 LAB — URINE CULTURE: Organism ID, Bacteria: NO GROWTH

## 2016-10-04 ENCOUNTER — Encounter: Payer: Self-pay | Admitting: Family Medicine

## 2016-10-11 DIAGNOSIS — Z23 Encounter for immunization: Secondary | ICD-10-CM | POA: Diagnosis not present

## 2017-01-21 IMAGING — US US RENAL
1 series · 14 of 25 positions shown · non-contrast
Comparison: None.

CLINICAL DATA: Microscopic hematuria. Right-sided back pain 1
month.

EXAM:
RENAL / URINARY TRACT ULTRASOUND COMPLETE

[Series 1: us renal · 0.24mm/px · 14 of 31 slices shown]
[im 1/31]
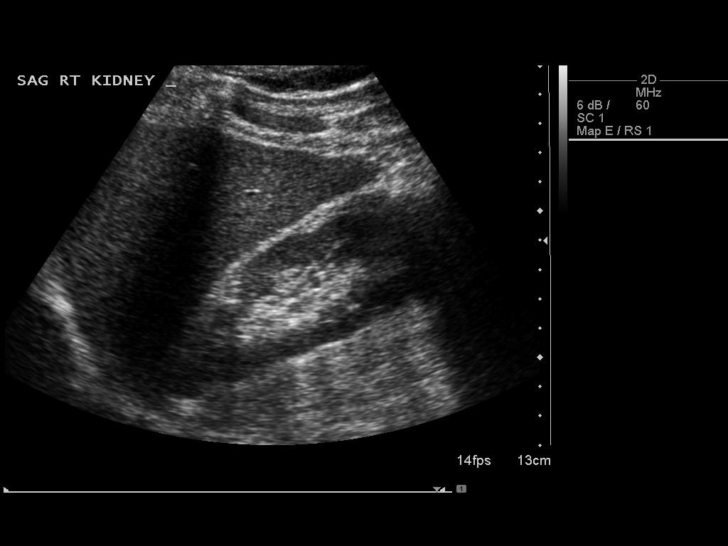
[im 3/31]
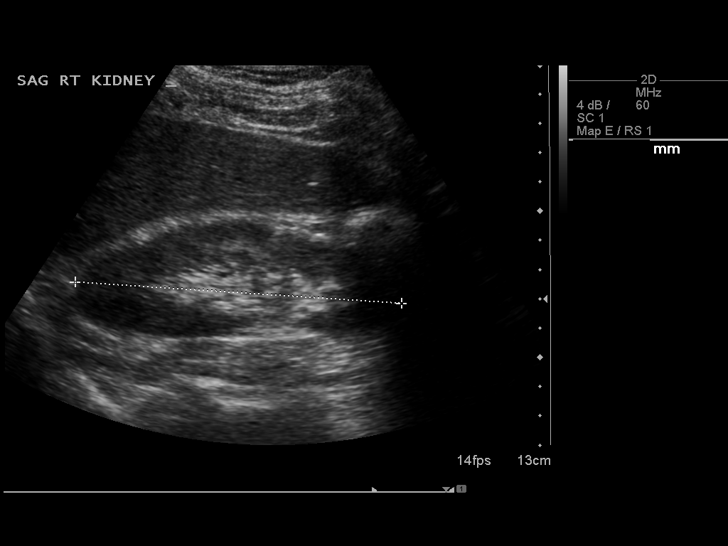
[im 6/31]
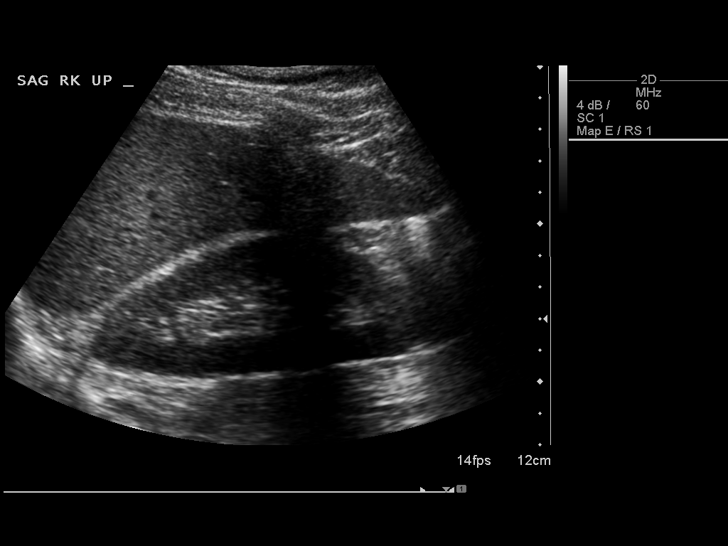
[im 8/31]
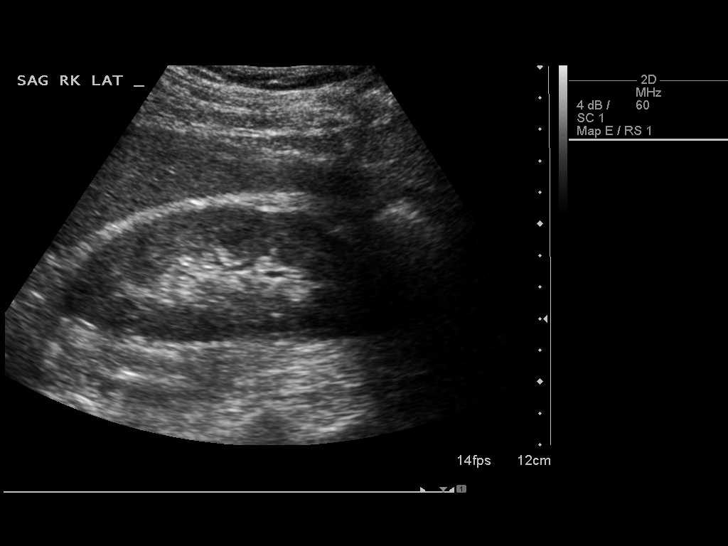
[im 11/31]
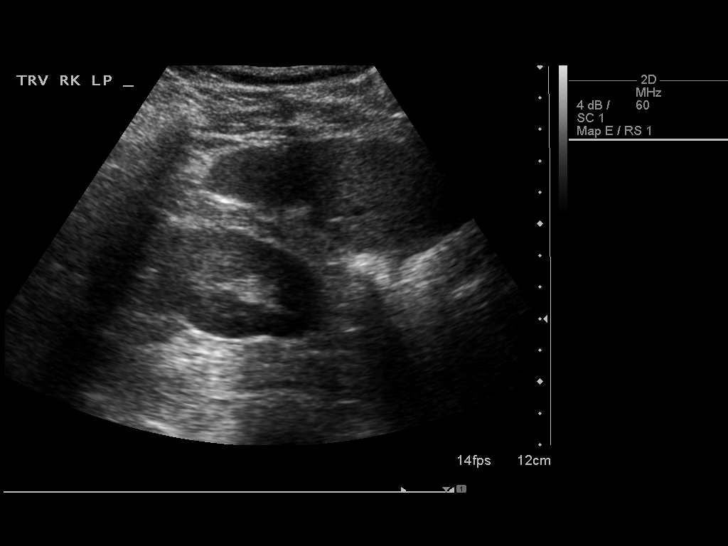
[im 12/31]
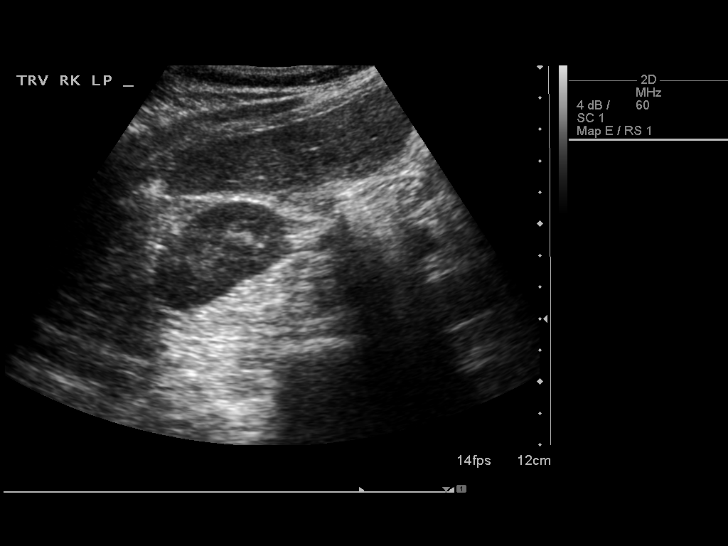
[im 14/31]
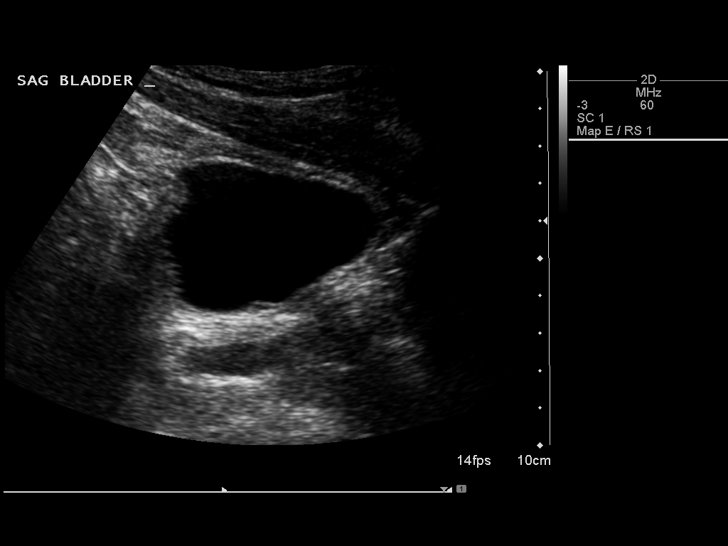
[im 17/31]
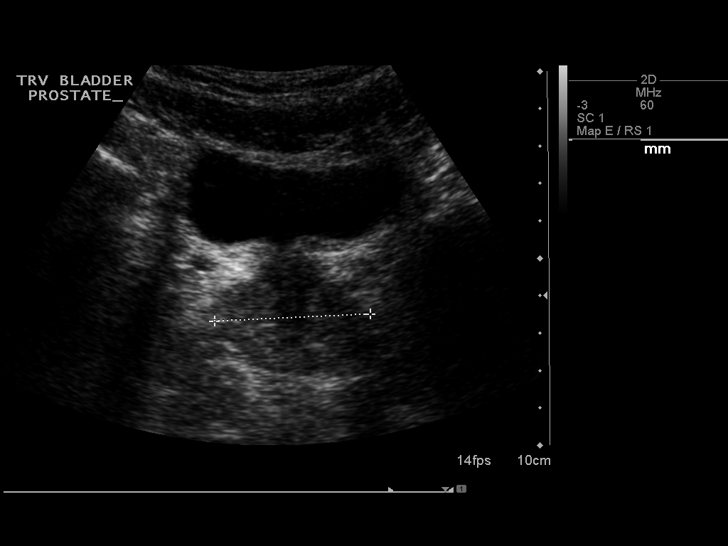
[im 19/31]
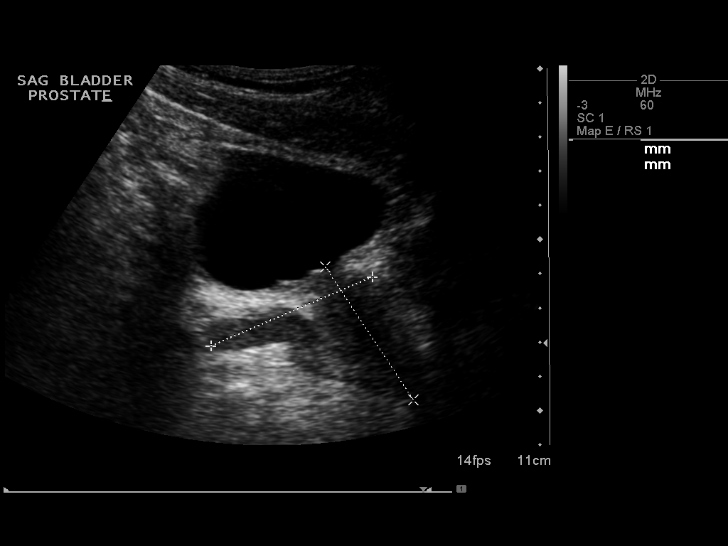
[im 21/31]
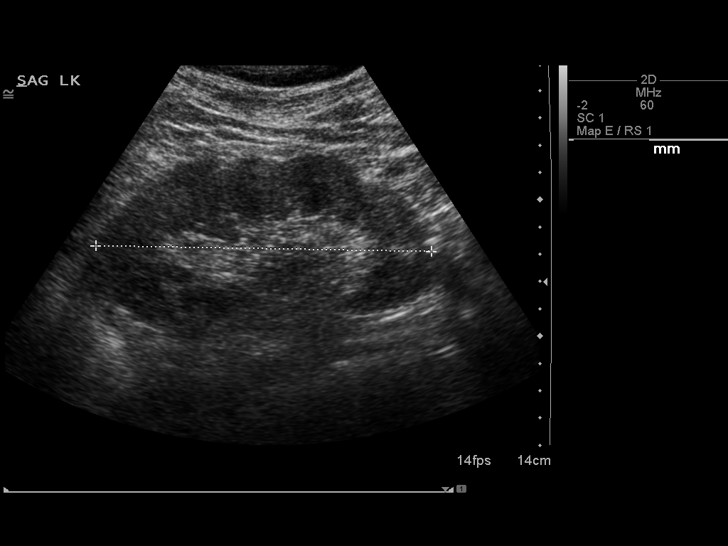
[im 23/31]
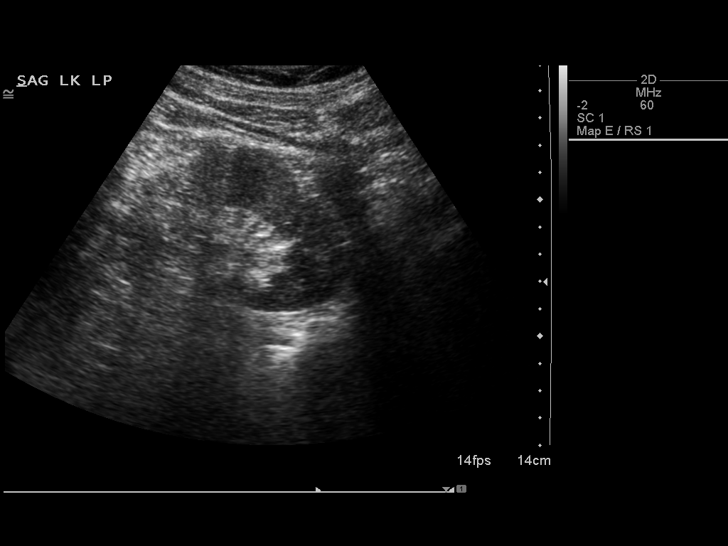
[im 26/31]
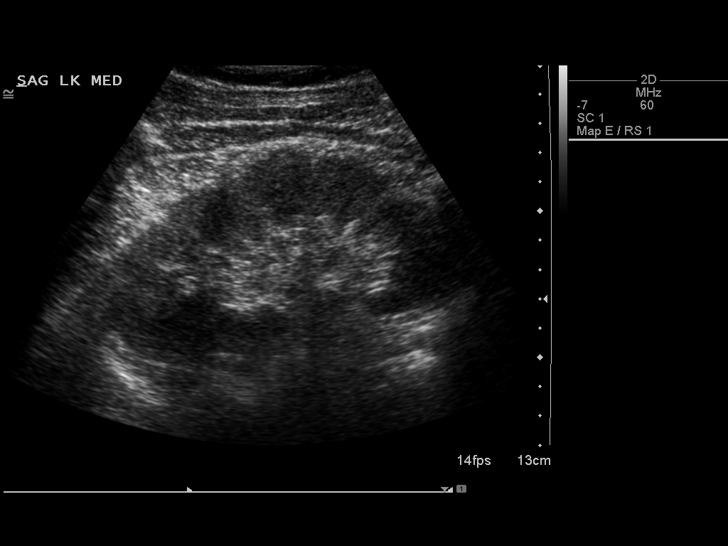
[im 28/31]
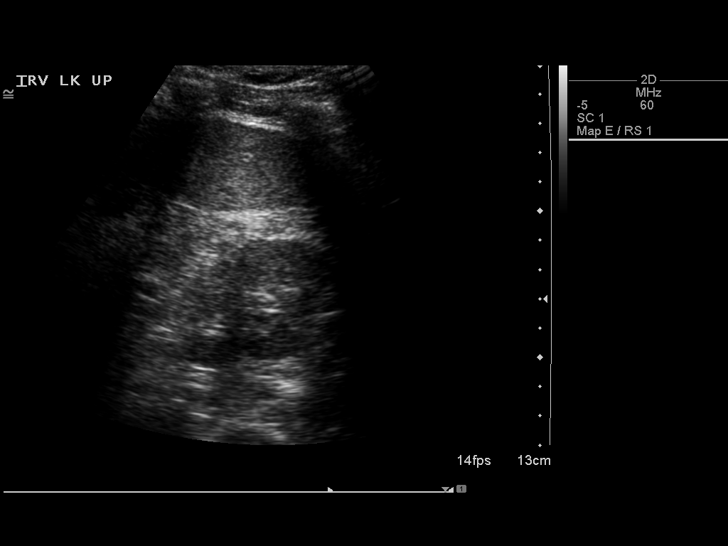
[im 31/31]
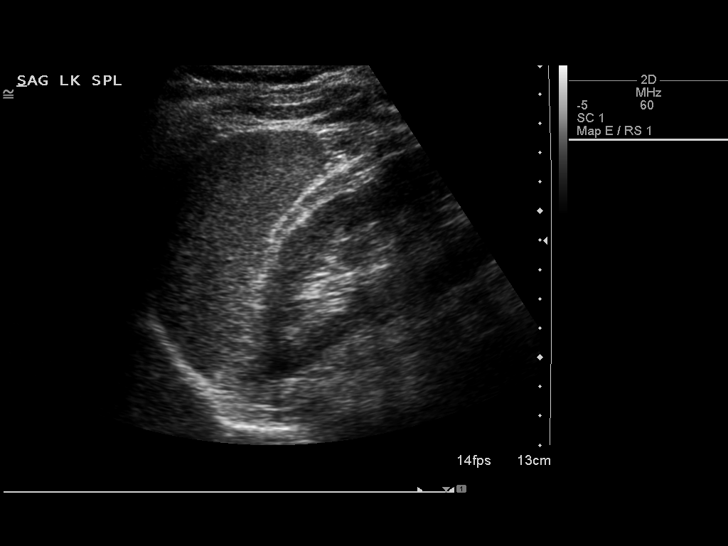

[14 of 25 positions shown; findings below may reference images not displayed]

FINDINGS: Right Kidney:

Length: 11.5 cm. Echogenicity within normal limits. No mass or
hydronephrosis visualized.

Left Kidney:

Length: 13.0 cm. Echogenicity within normal limits. No mass or
hydronephrosis visualized.

Bladder:

Appears normal for degree of bladder distention.

Mild prominence of the prostate measuring 4.2 x 5.1 x 4.7 cm.
IMPRESSION: Normal renal ultrasound.

## 2017-01-30 IMAGING — CT CT ABD-PELV W/ CM
2 of 5 series · 16 of 46 positions shown, 18 images · IV contrast (omnipaque)
Comparison: None

CLINICAL DATA: RIGHT flank pain and trace hematuria for 1 month

EXAM:
CT ABDOMEN AND PELVIS WITH CONTRAST
TECHNIQUE: Multidetector CT imaging of the abdomen and pelvis was performed
using the standard protocol following bolus administration of
intravenous contrast. Sagittal and coronal MPR images reconstructed
from axial data set.
CONTRAST:  100mL OMNIPAQUE IOHEXOL 300 MG/ML SOLN IV. Dilute oral
contrast.

[Series 2: abd/ pelvis · axial · 0.73mm/px · z∈[-530,-126]mm · 13 of 91 slices shown, 15 images]
[im 5/91  soft-tissue]
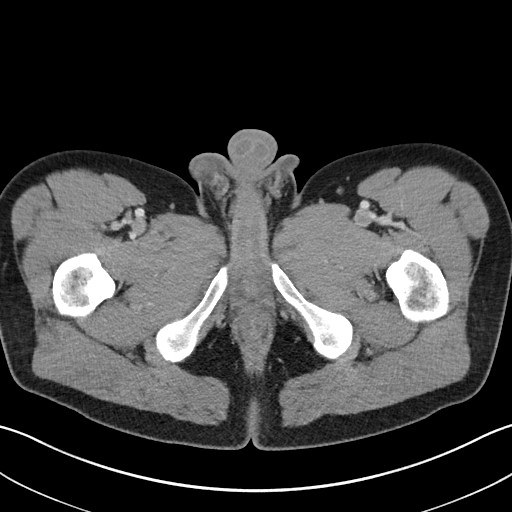
[im 5/91  bone]
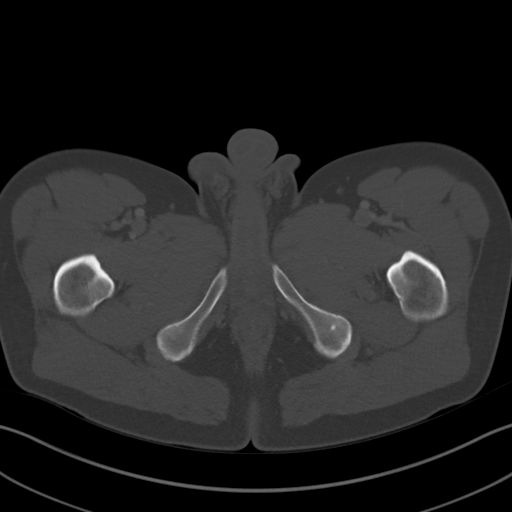
[im 15/91  soft-tissue]
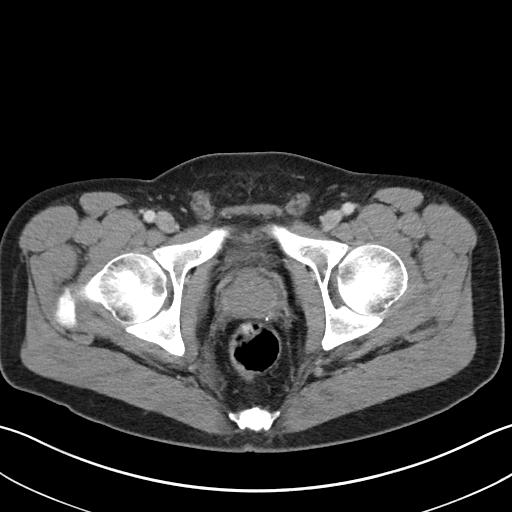
[im 19/91  soft-tissue]
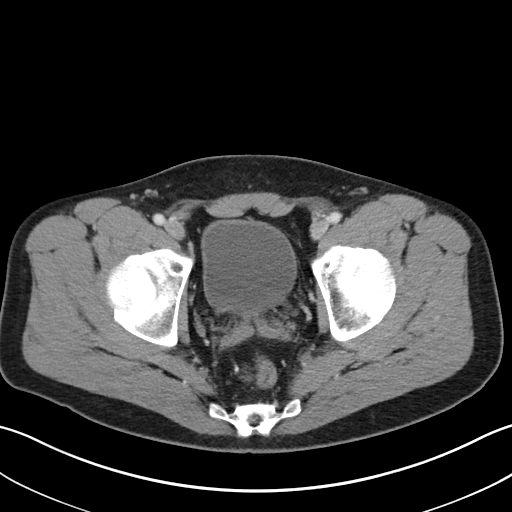
[im 24/91  soft-tissue]
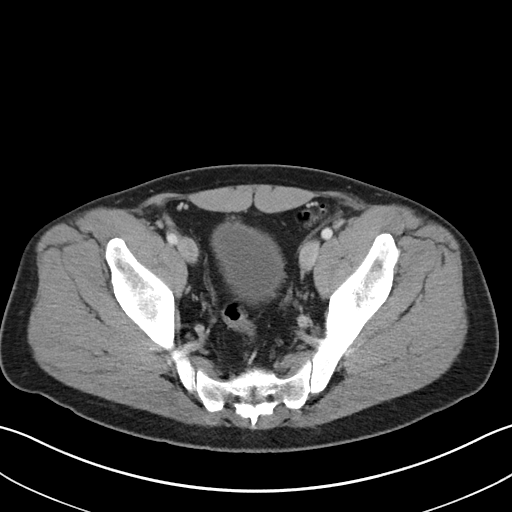
[im 34/91  soft-tissue]
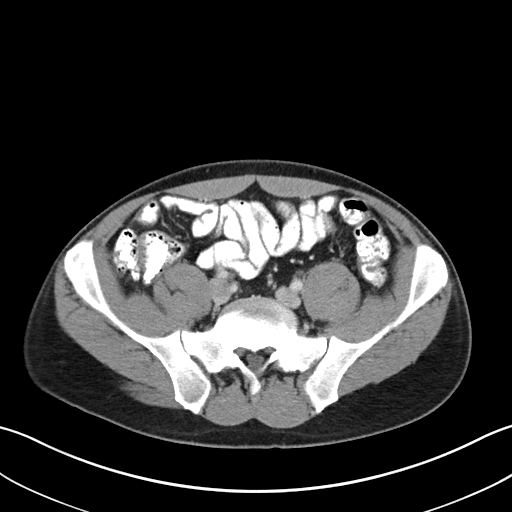
[im 38/91  soft-tissue]
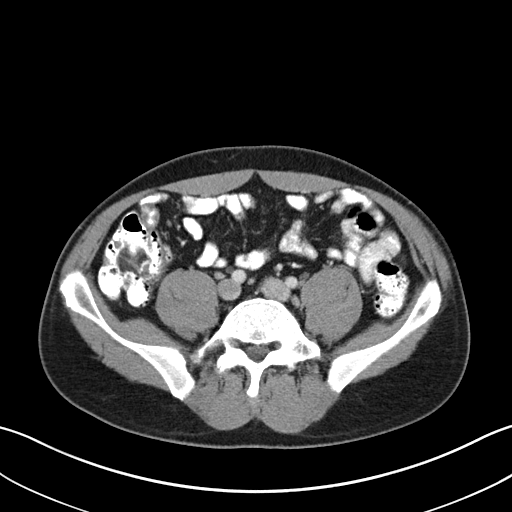
[im 48/91  soft-tissue]
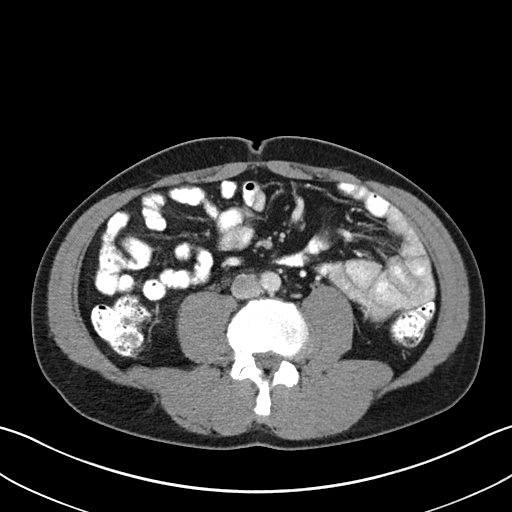
[im 53/91  soft-tissue]
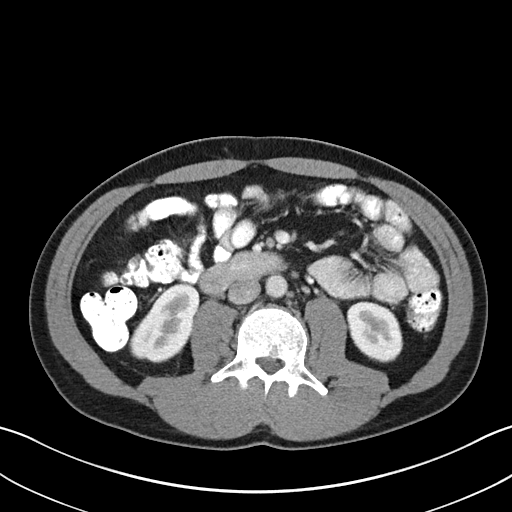
[im 57/91  soft-tissue]
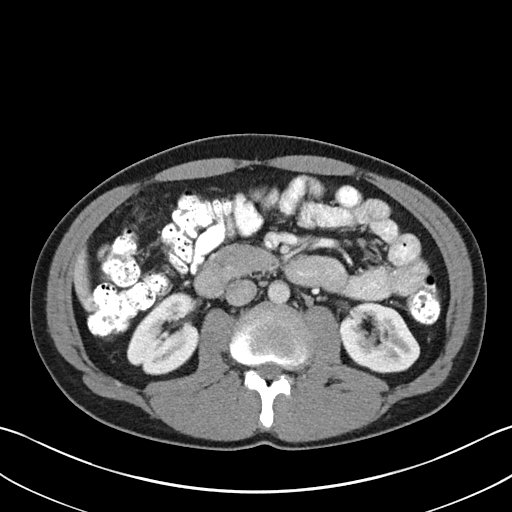
[im 57/91  bone]
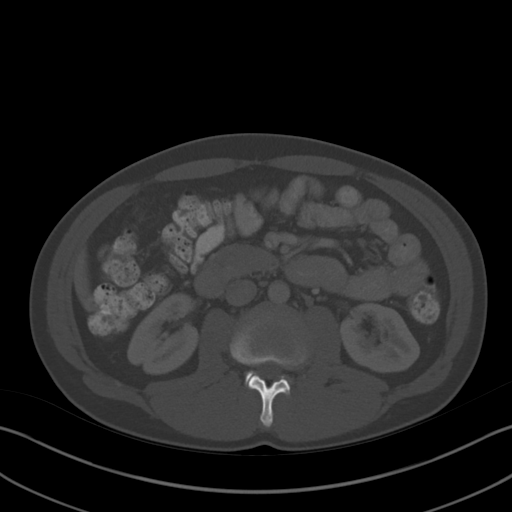
[im 67/91  soft-tissue]
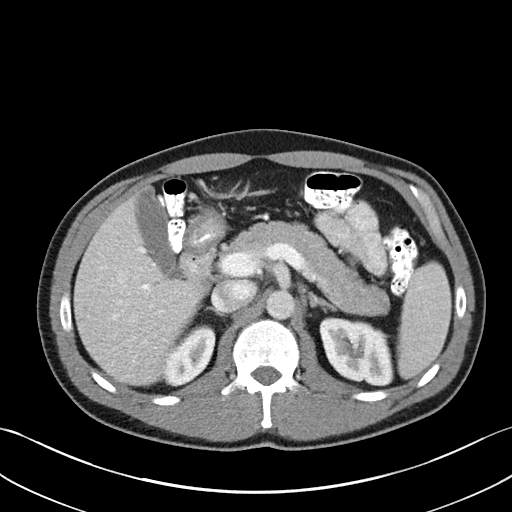
[im 72/91  soft-tissue]
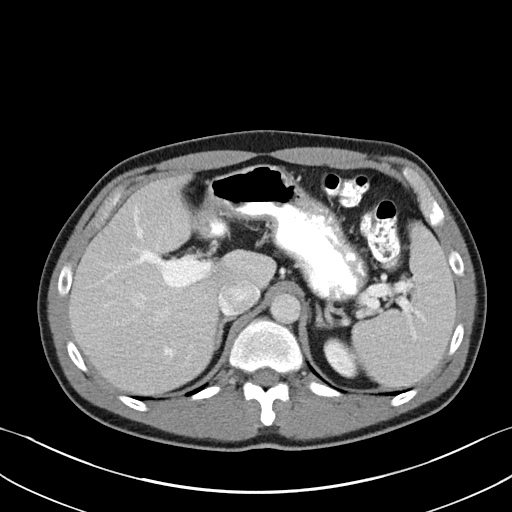
[im 76/91  soft-tissue]
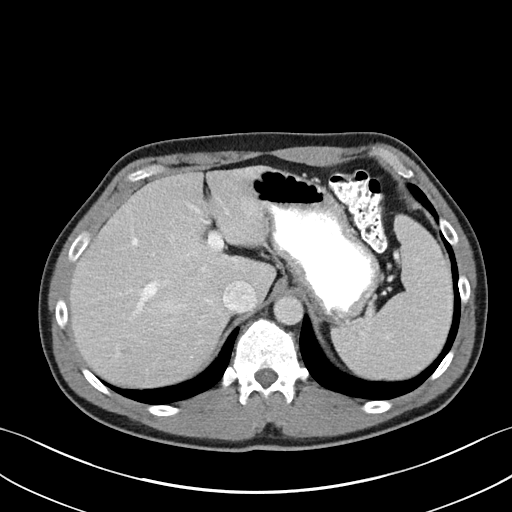
[im 86/91  soft-tissue]
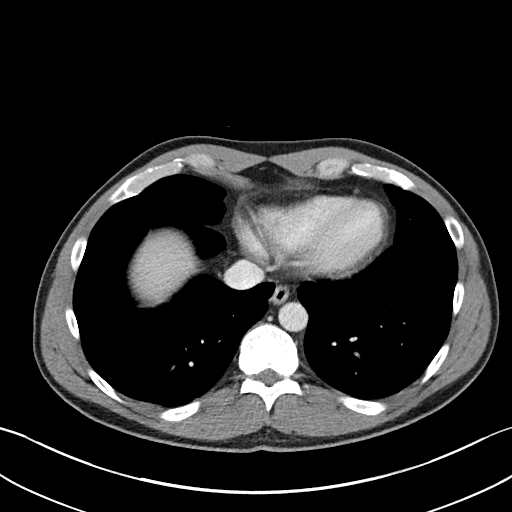

[Series 5: coronal soft tissue · coronal · 0.68mm/px · 3 of 73 slices shown]
[im 25/73  soft-tissue]
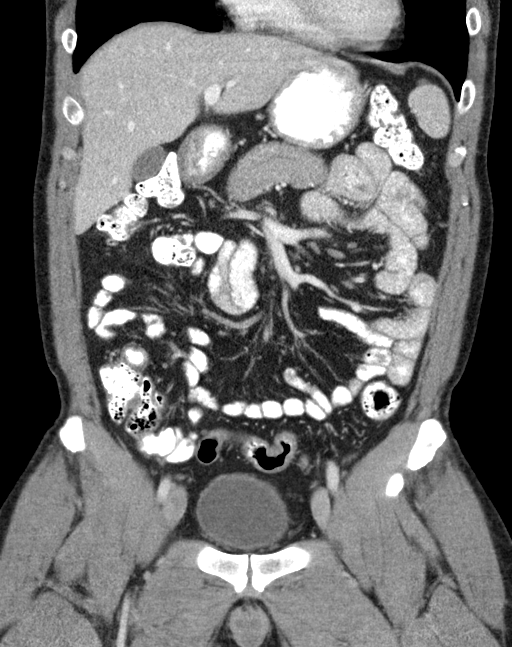
[im 33/73  soft-tissue]
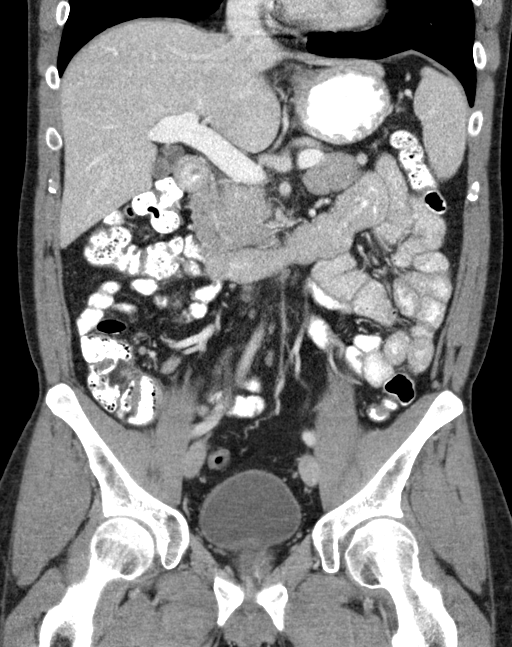
[im 41/73  soft-tissue]
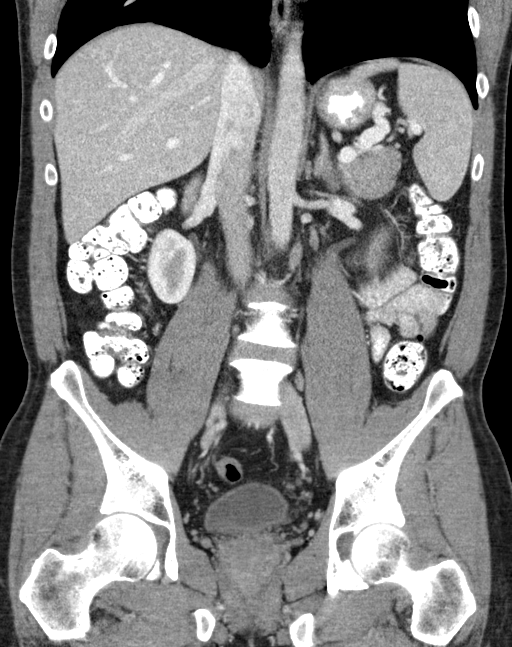

[16 of 46 positions shown; findings below may reference images not displayed]

FINDINGS: Lung bases clear.

Liver, gallbladder, spleen, pancreas, kidneys, and adrenal glands
normal.

Appendix not definitely visualized.

No pericecal inflammatory process.

Stomach and bowel loops normal appearance.

Bladder, ureters, and prostate gland normal appearance.

Scattered normal sized retroperitoneal and mesenteric nodes.

No mass, adenopathy, free air, free fluid, or inflammatory process.

Bones unremarkable.

Tiny umbilical radicular to use the.
IMPRESSION: Tiny umbilical hernia containing fat.

Otherwise negative exam.

## 2017-03-20 ENCOUNTER — Other Ambulatory Visit: Payer: Self-pay | Admitting: Family Medicine

## 2017-03-21 MED ORDER — ADDERALL 20 MG PO TABS: 10.0000 mg | ORAL_TABLET | Freq: Two times a day (BID) | ORAL | 0 refills | Status: DC

## 2017-03-21 MED ORDER — LEXAPRO 20 MG PO TABS: 20.0000 mg | ORAL_TABLET | Freq: Every day | ORAL | 3 refills | Status: DC

## 2017-03-21 NOTE — Telephone Encounter (Signed)
Done

## 2017-03-21 NOTE — Telephone Encounter (Signed)
Last OV 08/30/2016   adderall last refilled 06/18/2016 disp 30 with no refills  lexapro last refilled 06/18/2016 disp 90 with no refills   Sent to PCP for approval

## 2017-04-01 ENCOUNTER — Encounter: Payer: Self-pay | Admitting: Family Medicine

## 2017-06-12 DIAGNOSIS — D2271 Melanocytic nevi of right lower limb, including hip: Secondary | ICD-10-CM | POA: Diagnosis not present

## 2017-06-12 DIAGNOSIS — D225 Melanocytic nevi of trunk: Secondary | ICD-10-CM | POA: Diagnosis not present

## 2017-06-12 DIAGNOSIS — Z86018 Personal history of other benign neoplasm: Secondary | ICD-10-CM | POA: Diagnosis not present

## 2017-06-12 DIAGNOSIS — D485 Neoplasm of uncertain behavior of skin: Secondary | ICD-10-CM | POA: Diagnosis not present

## 2017-08-04 ENCOUNTER — Other Ambulatory Visit: Payer: Self-pay | Admitting: Family Medicine

## 2017-08-04 ENCOUNTER — Encounter: Payer: Self-pay | Admitting: Family Medicine

## 2017-08-05 NOTE — Telephone Encounter (Signed)
Pt stated that they are OK to wait until Thursday. Pt stated that they will need their xanax refilled as well with the Adderall.  Xanax was last refilled 03/11/2015 disp 60 with 5 refills   Sent to PCP to advise

## 2017-08-05 NOTE — Telephone Encounter (Signed)
Sent pt a Mychart message to advise.  

## 2017-08-05 NOTE — Telephone Encounter (Signed)
Last OV 08/30/2016    Last refilled 03/21/2017, 04/21/2017, and 05/21/2017 disp 30 with no refills   PCP is out of the office and will NOT be back until Thursday will need to call pt to advise

## 2017-08-08 MED ORDER — ALPRAZOLAM 0.5 MG PO TABS: 0.5000 mg | ORAL_TABLET | Freq: Two times a day (BID) | ORAL | 5 refills | Status: DC | PRN

## 2017-08-08 MED ORDER — ADDERALL 20 MG PO TABS
10.0000 mg | ORAL_TABLET | Freq: Two times a day (BID) | ORAL | 0 refills | Status: DC
Start: 2017-10-09 — End: 2018-01-13

## 2017-08-08 MED ORDER — ADDERALL 20 MG PO TABS
10.0000 mg | ORAL_TABLET | Freq: Two times a day (BID) | ORAL | 0 refills | Status: DC
Start: 2017-08-08 — End: 2017-08-08

## 2017-08-08 MED ORDER — ADDERALL 20 MG PO TABS: 10.0000 mg | ORAL_TABLET | Freq: Two times a day (BID) | ORAL | 0 refills | Status: DC

## 2017-08-08 NOTE — Telephone Encounter (Signed)
Done

## 2017-09-11 ENCOUNTER — Encounter: Payer: Self-pay | Admitting: Family Medicine

## 2017-09-18 MED ORDER — TADALAFIL 20 MG PO TABS: 20.0000 mg | ORAL_TABLET | Freq: Every day | ORAL | 11 refills | Status: DC | PRN

## 2017-09-18 MED ORDER — TRIAMCINOLONE ACETONIDE 0.1 % EX CREA: 1.0000 "application " | TOPICAL_CREAM | Freq: Three times a day (TID) | CUTANEOUS | 5 refills | Status: DC

## 2017-09-18 NOTE — Telephone Encounter (Signed)
The Rx is ready to pick up

## 2017-10-14 DIAGNOSIS — Z23 Encounter for immunization: Secondary | ICD-10-CM | POA: Diagnosis not present

## 2018-01-03 ENCOUNTER — Telehealth: Payer: Self-pay | Admitting: Family Medicine

## 2018-01-06 NOTE — Telephone Encounter (Signed)
Dr. Fry please advise on refill of medication.  Thanks  

## 2018-01-09 NOTE — Telephone Encounter (Signed)
He will need an OV to refill the Adderall. Its been a year and a half since he was here

## 2018-01-09 NOTE — Telephone Encounter (Signed)
I have called and lmom for the pt to call back and schedule an appt to get these meds refilled

## 2018-01-10 NOTE — Telephone Encounter (Signed)
Pt returned call. Pt has been scheduled.  °

## 2018-01-13 ENCOUNTER — Ambulatory Visit: Payer: BLUE CROSS/BLUE SHIELD | Admitting: Family Medicine

## 2018-01-13 ENCOUNTER — Encounter: Payer: Self-pay | Admitting: Family Medicine

## 2018-01-13 VITALS — BP 118/74 | HR 82 | Temp 99.2°F | Wt 180.1 lb

## 2018-01-13 DIAGNOSIS — F900 Attention-deficit hyperactivity disorder, predominantly inattentive type: Secondary | ICD-10-CM | POA: Diagnosis not present

## 2018-01-13 MED ORDER — ADDERALL 20 MG PO TABS
10.0000 mg | ORAL_TABLET | Freq: Two times a day (BID) | ORAL | 0 refills | Status: DC
Start: 2018-01-13 — End: 2018-01-13

## 2018-01-13 MED ORDER — ADDERALL 20 MG PO TABS: 10.0000 mg | ORAL_TABLET | Freq: Two times a day (BID) | ORAL | 0 refills | Status: DC

## 2018-01-13 MED ORDER — ADDERALL 20 MG PO TABS
10.0000 mg | ORAL_TABLET | Freq: Two times a day (BID) | ORAL | 0 refills | Status: DC
Start: 2018-02-13 — End: 2018-01-13

## 2018-01-13 MED ORDER — ALPRAZOLAM 0.5 MG PO TABS: 0.5000 mg | ORAL_TABLET | Freq: Two times a day (BID) | ORAL | 5 refills | Status: DC | PRN

## 2018-01-13 NOTE — Progress Notes (Signed)
   Subjective:    Patient ID: Joseph Davidson, male    DOB: 10/09/1965, 52 y.o.   MRN: [id number]  HPI Here to follow up on ADHD. He feels well and he is happy with the Adderall. He only takes it several days a week, depending on his work load.    Review of Systems  Constitutional: Negative.   Respiratory: Negative.   Cardiovascular: Negative.   Neurological: Negative.        Objective:   Physical Exam Constitutional:      Appearance: Normal appearance.  Cardiovascular:     Rate and Rhythm: Normal rate and regular rhythm.     Pulses: Normal pulses.     Heart sounds: Normal heart sounds.  Pulmonary:     Effort: Pulmonary effort is normal.     Breath sounds: Normal breath sounds.  Neurological:     General: No focal deficit present.     Mental Status: He is alert and oriented to person, place, and time.           Assessment & Plan:  ADHD, stable. Refilled Adderall. I advised him to schedule a well exam with labs sometime soon.  Gershon CraneStephen Jermesha Sottile, MD

## 2018-01-14 ENCOUNTER — Encounter: Payer: Self-pay | Admitting: Family Medicine

## 2018-01-14 MED ORDER — ADDERALL 20 MG PO TABS: 10.0000 mg | ORAL_TABLET | Freq: Two times a day (BID) | ORAL | 0 refills | Status: DC

## 2018-01-14 NOTE — Telephone Encounter (Signed)
This was sent in to the new pharmacy  

## 2018-01-14 NOTE — Telephone Encounter (Signed)
Dr. Fry please advise. Thanks  

## 2018-03-03 ENCOUNTER — Encounter: Payer: Self-pay | Admitting: Family Medicine

## 2018-03-03 NOTE — Telephone Encounter (Signed)
Dr. Fry please advise. Thanks  

## 2018-03-04 ENCOUNTER — Encounter: Payer: Self-pay | Admitting: Family Medicine

## 2018-03-04 MED ORDER — AMOXICILLIN-POT CLAVULANATE 875-125 MG PO TABS: 1.0000 | ORAL_TABLET | Freq: Two times a day (BID) | ORAL | 0 refills | Status: DC

## 2018-03-04 NOTE — Telephone Encounter (Signed)
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  Please advise. Thanks

## 2018-03-04 NOTE — Telephone Encounter (Signed)
Call in Augmentin [phone] bid for 10 days. He will need to be seen if this does not help

## 2018-03-04 NOTE — Telephone Encounter (Signed)
These can easily get infected, he should make an OV

## 2018-03-31 ENCOUNTER — Other Ambulatory Visit: Payer: Self-pay | Admitting: Family Medicine

## 2018-06-02 ENCOUNTER — Encounter: Payer: Self-pay | Admitting: Family Medicine

## 2018-06-03 ENCOUNTER — Other Ambulatory Visit: Payer: Self-pay | Admitting: Adult Health

## 2018-06-03 MED ORDER — AZITHROMYCIN 250 MG PO TABS: ORAL_TABLET | ORAL | 0 refills | Status: DC

## 2018-06-03 NOTE — Telephone Encounter (Signed)
Kandee Keen could you address this for Dr. Clent Ridges since he is out all week.  Thanks

## 2018-07-17 DIAGNOSIS — Z86018 Personal history of other benign neoplasm: Secondary | ICD-10-CM | POA: Diagnosis not present

## 2018-07-17 DIAGNOSIS — D2262 Melanocytic nevi of left upper limb, including shoulder: Secondary | ICD-10-CM | POA: Diagnosis not present

## 2018-07-17 DIAGNOSIS — D225 Melanocytic nevi of trunk: Secondary | ICD-10-CM | POA: Diagnosis not present

## 2018-07-17 DIAGNOSIS — D2271 Melanocytic nevi of right lower limb, including hip: Secondary | ICD-10-CM | POA: Diagnosis not present

## 2018-07-17 DIAGNOSIS — D485 Neoplasm of uncertain behavior of skin: Secondary | ICD-10-CM | POA: Diagnosis not present

## 2018-07-17 DIAGNOSIS — B078 Other viral warts: Secondary | ICD-10-CM | POA: Diagnosis not present

## 2018-12-18 ENCOUNTER — Other Ambulatory Visit: Payer: Self-pay | Admitting: Family Medicine

## 2019-01-20 ENCOUNTER — Encounter: Payer: Self-pay | Admitting: Family Medicine

## 2019-01-20 DIAGNOSIS — Z0000 Encounter for general adult medical examination without abnormal findings: Secondary | ICD-10-CM

## 2019-01-21 NOTE — Telephone Encounter (Signed)
I did the referral 

## 2019-02-23 ENCOUNTER — Encounter: Payer: Self-pay | Admitting: Family Medicine

## 2019-02-23 NOTE — Telephone Encounter (Signed)
Adderall last filled 03/15/2018 Alprazolam last filled 01/13/2018  Last OV 01/13/2018

## 2019-02-24 NOTE — Telephone Encounter (Signed)
He will need an OV for this. Virtual would be okay

## 2019-03-03 ENCOUNTER — Telehealth: Payer: Self-pay | Admitting: Family Medicine

## 2019-03-03 ENCOUNTER — Telehealth (INDEPENDENT_AMBULATORY_CARE_PROVIDER_SITE_OTHER): Payer: BC Managed Care – PPO | Admitting: Family Medicine

## 2019-03-03 ENCOUNTER — Other Ambulatory Visit: Payer: Self-pay

## 2019-03-03 DIAGNOSIS — F418 Other specified anxiety disorders: Secondary | ICD-10-CM

## 2019-03-03 DIAGNOSIS — F900 Attention-deficit hyperactivity disorder, predominantly inattentive type: Secondary | ICD-10-CM | POA: Diagnosis not present

## 2019-03-03 MED ORDER — ADDERALL 20 MG PO TABS: 10.0000 mg | ORAL_TABLET | Freq: Two times a day (BID) | ORAL | 0 refills | Status: DC

## 2019-03-03 MED ORDER — LEXAPRO 20 MG PO TABS: 20.0000 mg | ORAL_TABLET | Freq: Every day | ORAL | 3 refills | Status: DC

## 2019-03-03 MED ORDER — ALPRAZOLAM 0.5 MG PO TABS: 0.5000 mg | ORAL_TABLET | Freq: Two times a day (BID) | ORAL | 5 refills | Status: AC | PRN

## 2019-03-03 NOTE — Progress Notes (Signed)
Virtual Visit via Video Note  I connected with the patient on 03/03/19 at 11:15 AM EST by a video enabled telemedicine application and verified that I am speaking with the correct person using two identifiers.  Location patient: home Location provider:work or home office Persons participating in the virtual visit: patient, provider  I discussed the limitations of evaluation and management by telemedicine and the availability of in person appointments. The patient expressed understanding and agreed to proceed.   HPI: Here for medication refills. He is doing well. His ADHD is stable. His depression is stable.    ROS: See pertinent positives and negatives per HPI.  Past Medical History:  Diagnosis Date  . ADHD (attention deficit hyperactivity disorder)   . Anxiety   . Chicken pox   . Depression   . Hyperlipidemia     Past Surgical History:  Procedure Laterality Date  . benign mole     sees Dr. Elmon Else   . WISDOM TOOTH EXTRACTION      Family History  Problem Relation Age of Onset  . Schizophrenia Other   . Diabetes Other   . Cancer Other        prostate  . Stroke Other      Current Outpatient Medications:  .  ADDERALL 20 MG tablet, Take 0.5 tablets (10 mg total) by mouth 2 (two) times daily. As needed, Disp: 30 tablet, Rfl: 0 .  ALPRAZolam (XANAX) 0.5 MG tablet, Take 1 tablet (0.5 mg total) by mouth 2 (two) times daily as needed for anxiety., Disp: 60 tablet, Rfl: 5 .  LEXAPRO 20 MG tablet, TAKE 1 TABLET BY MOUTH EVERY DAY, Disp: 90 tablet, Rfl: 0 .  Multiple Vitamin (MULTIVITAMIN) tablet, Take 1 tablet by mouth daily., Disp: , Rfl:  .  triamcinolone cream (KENALOG) 0.1 %, Apply 1 application topically 3 (three) times daily., Disp: 45 g, Rfl: 5  EXAM:  VITALS per patient if applicable:  GENERAL: alert, oriented, appears well and in no acute distress  HEENT: atraumatic, conjunttiva clear, no obvious abnormalities on inspection of external nose and ears  NECK:  normal movements of the head and neck  LUNGS: on inspection no signs of respiratory distress, breathing rate appears normal, no obvious gross SOB, gasping or wheezing  CV: no obvious cyanosis  MS: moves all visible extremities without noticeable abnormality  PSYCH/NEURO: pleasant and cooperative, no obvious depression or anxiety, speech and thought processing grossly intact  ASSESSMENT AND PLAN: Stable depression with anxiety and ADHD. Meds were refilled.  Gershon Crane, MD  Discussed the following assessment and plan:  No diagnosis found.     I discussed the assessment and treatment plan with the patient. The patient was provided an opportunity to ask questions and all were answered. The patient agreed with the plan and demonstrated an understanding of the instructions.   The patient was advised to call back or seek an in-person evaluation if the symptoms worsen or if the condition fails to improve as anticipated.

## 2019-03-03 NOTE — Telephone Encounter (Signed)
[facility] informed that pt needs a prior [place] for insurance before they can fill his prescription for Adderall.

## 2019-03-10 ENCOUNTER — Encounter: Payer: Self-pay | Admitting: Family Medicine

## 2019-03-11 MED ORDER — AMPHETAMINE-DEXTROAMPHETAMINE 20 MG PO TABS: 10.0000 mg | ORAL_TABLET | Freq: Two times a day (BID) | ORAL | 0 refills | Status: DC

## 2019-03-11 MED ORDER — TRIAMCINOLONE ACETONIDE 0.1 % EX CREA: 1.0000 "application " | TOPICAL_CREAM | Freq: Three times a day (TID) | CUTANEOUS | 5 refills | Status: DC

## 2019-03-11 NOTE — Telephone Encounter (Signed)
Done

## 2019-03-20 ENCOUNTER — Encounter: Payer: Self-pay | Admitting: Family Medicine

## 2019-05-07 DIAGNOSIS — Z03818 Encounter for observation for suspected exposure to other biological agents ruled out: Secondary | ICD-10-CM | POA: Diagnosis not present

## 2019-07-20 ENCOUNTER — Encounter: Payer: Self-pay | Admitting: Family Medicine

## 2019-07-22 NOTE — Telephone Encounter (Signed)
Ok for change.

## 2019-07-23 MED ORDER — ADDERALL 20 MG PO TABS: 20.0000 mg | ORAL_TABLET | Freq: Two times a day (BID) | ORAL | 0 refills | Status: DC

## 2019-07-23 NOTE — Telephone Encounter (Signed)
I sent in for the name brand only

## 2019-07-29 NOTE — Telephone Encounter (Signed)
Please do the PA or whatever is needed to get the brand name

## 2019-07-30 NOTE — Telephone Encounter (Signed)
Key: Marti Sleigh

## 2019-08-05 DIAGNOSIS — D225 Melanocytic nevi of trunk: Secondary | ICD-10-CM | POA: Diagnosis not present

## 2019-08-05 DIAGNOSIS — D224 Melanocytic nevi of scalp and neck: Secondary | ICD-10-CM | POA: Diagnosis not present

## 2019-08-05 DIAGNOSIS — D485 Neoplasm of uncertain behavior of skin: Secondary | ICD-10-CM | POA: Diagnosis not present

## 2019-08-05 DIAGNOSIS — L820 Inflamed seborrheic keratosis: Secondary | ICD-10-CM | POA: Diagnosis not present

## 2019-08-05 DIAGNOSIS — L821 Other seborrheic keratosis: Secondary | ICD-10-CM | POA: Diagnosis not present

## 2019-08-20 NOTE — Telephone Encounter (Signed)
There is nothing else I can do. Either he pays cash for the name brand Adderall or he can use a generic. The choice is his

## 2019-09-28 ENCOUNTER — Telehealth: Payer: Self-pay

## 2019-09-28 NOTE — Telephone Encounter (Signed)
[facility] called and spoke to [place], [facility] about the PA for Adderall. He says the medication still will not go through and a new prescription will not fix the problem. He suggests send in another PA. Will call [facility] tomorrow due to they are now closed.

## 2019-09-29 NOTE — Telephone Encounter (Signed)
[employer] called and spoke to intake agent about PA form, she says she will fax the form. She says the form is faxed to [phone] as given to her.

## 2019-10-25 ENCOUNTER — Encounter: Payer: Self-pay | Admitting: Family Medicine

## 2019-10-28 MED ORDER — TADALAFIL 20 MG PO TABS: 20.0000 mg | ORAL_TABLET | Freq: Every day | ORAL | 3 refills | Status: DC | PRN

## 2019-10-28 NOTE — Telephone Encounter (Signed)
The rx is ready to fax  

## 2019-12-10 ENCOUNTER — Other Ambulatory Visit: Payer: Self-pay | Admitting: Family Medicine

## 2019-12-15 ENCOUNTER — Encounter: Payer: Self-pay | Admitting: Family Medicine

## 2019-12-15 MED ORDER — ADDERALL 20 MG PO TABS: 20.0000 mg | ORAL_TABLET | Freq: Two times a day (BID) | ORAL | 0 refills | Status: DC

## 2019-12-15 MED ORDER — ALPRAZOLAM 0.5 MG PO TABS: 0.5000 mg | ORAL_TABLET | Freq: Two times a day (BID) | ORAL | 5 refills | Status: DC | PRN

## 2019-12-15 NOTE — Telephone Encounter (Signed)
Done

## 2020-03-12 ENCOUNTER — Other Ambulatory Visit: Payer: Self-pay | Admitting: Family Medicine

## 2020-06-06 ENCOUNTER — Encounter: Payer: Self-pay | Admitting: Family Medicine

## 2020-06-08 NOTE — Telephone Encounter (Signed)
We will get all blood work the day of the physical

## 2020-07-05 ENCOUNTER — Ambulatory Visit (INDEPENDENT_AMBULATORY_CARE_PROVIDER_SITE_OTHER): Payer: Managed Care, Other (non HMO) | Admitting: Family Medicine

## 2020-07-05 ENCOUNTER — Encounter: Payer: Self-pay | Admitting: Family Medicine

## 2020-07-05 VITALS — BP 118/80 | HR 68 | Temp 98.5°F | Wt 175.0 lb

## 2020-07-05 DIAGNOSIS — Z0000 Encounter for general adult medical examination without abnormal findings: Secondary | ICD-10-CM | POA: Diagnosis not present

## 2020-07-05 LAB — BASIC METABOLIC PANEL
BUN: 18 mg/dL (ref 6–23)
CO2: 30 mEq/L (ref 19–32)
Calcium: 10 mg/dL (ref 8.4–10.5)
Chloride: 100 mEq/L (ref 96–112)
Creatinine, Ser: 0.97 mg/dL (ref 0.40–1.50)
GFR: 89.68 mL/min (ref >60.00)
Glucose, Bld: 78 mg/dL (ref 70–99)
Potassium: 4.1 mEq/L (ref 3.5–5.1)
Sodium: 138 mEq/L (ref 135–145)

## 2020-07-05 LAB — CBC WITH DIFFERENTIAL/PLATELET
Basophils Absolute: 0 10*3/uL (ref 0.0–0.1)
Basophils Relative: 0.6 % (ref 0.0–3.0)
Eosinophils Absolute: 0.2 10*3/uL (ref 0.0–0.7)
Eosinophils Relative: 2.2 % (ref 0.0–5.0)
HCT: 46.6 % (ref 39.0–52.0)
Hemoglobin: 16.3 g/dL (ref 13.0–17.0)
Lymphocytes Relative: 28 % (ref 12.0–46.0)
Lymphs Abs: 2 10*3/uL (ref 0.7–4.0)
MCHC: 34.9 g/dL (ref 30.0–36.0)
MCV: 86.8 fl (ref 78.0–100.0)
Monocytes Absolute: 0.5 10*3/uL (ref 0.1–1.0)
Monocytes Relative: 6.8 % (ref 3.0–12.0)
Neutro Abs: 4.5 10*3/uL (ref 1.4–7.7)
Neutrophils Relative %: 62.4 % (ref 43.0–77.0)
Platelets: 224 10*3/uL (ref 150.0–400.0)
RBC: 5.37 Mil/uL (ref 4.22–5.81)
RDW: 13.9 % (ref 11.5–15.5)
WBC: 7.2 10*3/uL (ref 4.0–10.5)

## 2020-07-05 LAB — LIPID PANEL
Cholesterol: 284 mg/dL — ABNORMAL HIGH (ref 0–200)
HDL: 49.4 mg/dL (ref >39.00)
LDL Cholesterol: 198 mg/dL — ABNORMAL HIGH (ref 0–99)
NonHDL: 234.93
Total CHOL/HDL Ratio: 6
Triglycerides: 185 mg/dL — ABNORMAL HIGH (ref 0.0–149.0)
VLDL: 37 mg/dL (ref 0.0–40.0)

## 2020-07-05 LAB — HEPATIC FUNCTION PANEL
ALT: 23 U/L (ref 0–53)
AST: 20 U/L (ref 0–37)
Albumin: 4.7 g/dL (ref 3.5–5.2)
Alkaline Phosphatase: 68 U/L (ref 39–117)
Bilirubin, Direct: 0.1 mg/dL (ref 0.0–0.3)
Total Bilirubin: 0.6 mg/dL (ref 0.2–1.2)
Total Protein: 7.1 g/dL (ref 6.0–8.3)

## 2020-07-05 LAB — T3, FREE: T3, Free: 3.5 pg/mL (ref 2.3–4.2)

## 2020-07-05 LAB — VITAMIN D 25 HYDROXY (VIT D DEFICIENCY, FRACTURES): VITD: 42.71 ng/mL (ref 30.00–100.00)

## 2020-07-05 LAB — T4, FREE: Free T4: 0.76 ng/dL (ref 0.60–1.60)

## 2020-07-05 LAB — PSA: PSA: 1.63 ng/mL (ref 0.10–4.00)

## 2020-07-05 LAB — VITAMIN B12: Vitamin B-12: 361 pg/mL (ref 211–911)

## 2020-07-05 LAB — TESTOSTERONE: Testosterone: 425.3 ng/dL (ref 300.00–890.00)

## 2020-07-05 LAB — HEMOGLOBIN A1C: Hgb A1c MFr Bld: 5.6 % (ref 4.6–6.5)

## 2020-07-05 LAB — TSH: TSH: 3.6 u[IU]/mL (ref 0.35–4.50)

## 2020-07-05 NOTE — Addendum Note (Signed)
Addended by: Kandra Nicolas on: 07/05/2020 11:59 AM   Modules accepted: Orders

## 2020-07-05 NOTE — Progress Notes (Signed)
Subjective:    Patient ID: Joseph Davidson, male    DOB: [birth year], 55 y.o.   MRN: [id number]  HPI Here for a well exam. His main concern is constant fatigue. This started a few months ago and he can't seem to shake it. He exercises regularly and he eats a healthy diet. He seems to get enough sleep. He has been taking Lexapro for several years, and it is working well. His moods are stable.    Review of Systems  Constitutional:  Positive for fatigue.  HENT: Negative.    Eyes: Negative.   Respiratory: Negative.    Cardiovascular: Negative.   Gastrointestinal: Negative.   Genitourinary: Negative.   Musculoskeletal: Negative.   Skin: Negative.   Neurological: Negative.   Psychiatric/Behavioral: Negative.        Objective:   Physical Exam Constitutional:      General: He is not in acute distress.    Appearance: Normal appearance. He is well-developed. He is not diaphoretic.  HENT:     Head: Normocephalic and atraumatic.     Right Ear: External ear normal.     Left Ear: External ear normal.     Nose: Nose normal.     Mouth/Throat:     Pharynx: No oropharyngeal exudate.  Eyes:     General: No scleral icterus.       Right eye: No discharge.        Left eye: No discharge.     Conjunctiva/sclera: Conjunctivae normal.     Pupils: Pupils are equal, round, and reactive to light.  Neck:     Thyroid: No thyromegaly.     Vascular: No JVD.     Trachea: No tracheal deviation.  Cardiovascular:     Rate and Rhythm: Normal rate and regular rhythm.     Heart sounds: Normal heart sounds. No murmur heard.   No friction rub. No gallop.  Pulmonary:     Effort: Pulmonary effort is normal. No respiratory distress.     Breath sounds: Normal breath sounds. No wheezing or rales.  Chest:     Chest wall: No tenderness.  Abdominal:     General: Bowel sounds are normal. There is no distension.     Palpations: Abdomen is soft. There is no mass.     Tenderness: There is no abdominal tenderness.  There is no guarding or rebound.  Genitourinary:    Penis: Normal. No tenderness.      Testes: Normal.     Prostate: Normal.     Rectum: Normal. Guaiac result negative.  Musculoskeletal:        General: No tenderness. Normal range of motion.     Cervical back: Neck supple.  Lymphadenopathy:     Cervical: No cervical adenopathy.  Skin:    General: Skin is warm and dry.     Coloration: Skin is not pale.     Findings: No erythema or rash.  Neurological:     Mental Status: He is alert and oriented to person, place, and time.     Cranial Nerves: No cranial nerve deficit.     Motor: No abnormal muscle tone.     Coordination: Coordination normal.     Deep Tendon Reflexes: Reflexes are normal and symmetric. Reflexes normal.  Psychiatric:        Behavior: Behavior normal.        Thought Content: Thought content normal.        Judgment: Judgment normal.  Assessment & Plan:  Well exam. We discussed diet and exercise. Get fasting labs. Set up a colonoscopy. The etiology of his fatigue is not clear, but we should get some answers with the lab work.  Gershon Crane, MD

## 2020-07-11 ENCOUNTER — Other Ambulatory Visit: Payer: Self-pay

## 2020-07-11 MED ORDER — ATORVASTATIN CALCIUM 20 MG PO TABS: 10.0000 mg | ORAL_TABLET | Freq: Every day | ORAL | 3 refills | Status: DC

## 2020-07-14 ENCOUNTER — Other Ambulatory Visit: Payer: Self-pay

## 2020-07-14 DIAGNOSIS — E785 Hyperlipidemia, unspecified: Secondary | ICD-10-CM

## 2020-07-27 ENCOUNTER — Encounter: Payer: Self-pay | Admitting: Gastroenterology

## 2020-07-28 ENCOUNTER — Telehealth: Payer: Self-pay

## 2020-07-28 NOTE — Telephone Encounter (Signed)
Spoke with pt aware that new Rx for Lipitor was sent to his pharmacy, pt confirmed that he picked up Rx but is not taking medication, state that he is trying some OTC natural supplement to see if they help.

## 2020-09-26 ENCOUNTER — Encounter: Payer: Self-pay | Admitting: Family Medicine

## 2020-09-26 ENCOUNTER — Other Ambulatory Visit: Payer: Self-pay

## 2020-09-27 MED ORDER — ALPRAZOLAM 0.5 MG PO TABS: 0.5000 mg | ORAL_TABLET | Freq: Two times a day (BID) | ORAL | 5 refills | Status: DC | PRN

## 2020-09-27 MED ORDER — LEXAPRO 20 MG PO TABS: 20.0000 mg | ORAL_TABLET | Freq: Every day | ORAL | 3 refills | Status: DC

## 2020-09-27 NOTE — Telephone Encounter (Signed)
Done

## 2020-10-05 ENCOUNTER — Other Ambulatory Visit: Payer: Self-pay

## 2020-10-05 MED ORDER — ESCITALOPRAM OXALATE 20 MG PO TABS: 20.0000 mg | ORAL_TABLET | Freq: Every day | ORAL | 1 refills | Status: DC

## 2020-10-24 ENCOUNTER — Encounter: Payer: Self-pay | Admitting: Gastroenterology

## 2020-10-24 ENCOUNTER — Ambulatory Visit (AMBULATORY_SURGERY_CENTER): Payer: Managed Care, Other (non HMO) | Admitting: *Deleted

## 2020-10-24 VITALS — Ht 67.0 in | Wt 175.0 lb

## 2020-10-24 DIAGNOSIS — Z1211 Encounter for screening for malignant neoplasm of colon: Secondary | ICD-10-CM

## 2020-10-24 MED ORDER — SUTAB 1479-225-188 MG PO TABS: 24.0000 | ORAL_TABLET | ORAL | 0 refills | Status: DC

## 2020-10-24 NOTE — Progress Notes (Signed)
No egg or soy allergy known to patient  No issues with past sedation with any surgeries or procedures Patient denies ever being told they had issues or difficulty with intubation  No FH of Malignant Hyperthermia No diet pills per patient No home 02 use per patient  No blood thinners per patient  Pt denies issues with constipation  No A fib or A flutter  COVID 19 guidelines implemented in [place] today with Pt and CMA Pt is fully vaccinated  for Covid      Due to the COVID-19 pandemic we are asking patients to follow certain guidelines.  Pt aware of COVID protocols and [facility] guidelines   

## 2020-11-04 ENCOUNTER — Encounter: Payer: Managed Care, Other (non HMO) | Admitting: Gastroenterology

## 2020-11-07 ENCOUNTER — Other Ambulatory Visit: Payer: Self-pay | Admitting: Family Medicine

## 2020-11-09 ENCOUNTER — Ambulatory Visit (AMBULATORY_SURGERY_CENTER): Payer: Managed Care, Other (non HMO) | Admitting: Gastroenterology

## 2020-11-09 ENCOUNTER — Encounter: Payer: Self-pay | Admitting: Gastroenterology

## 2020-11-09 VITALS — BP 119/74 | HR 77 | Temp 98.9°F | Resp 14 | Ht 67.0 in | Wt 175.0 lb

## 2020-11-09 DIAGNOSIS — Z1211 Encounter for screening for malignant neoplasm of colon: Secondary | ICD-10-CM

## 2020-11-09 HISTORY — PX: COLONOSCOPY: SHX174

## 2020-11-09 MED ORDER — SODIUM CHLORIDE 0.9 % IV SOLN: 500.0000 mL | Freq: Once | INTRAVENOUS | Status: DC

## 2020-11-09 NOTE — Progress Notes (Signed)
A and O x3. Report to RN. Tolerated MAC anesthesia well. 

## 2020-11-09 NOTE — Op Note (Signed)
[facility] Patient Name: Joseph Davidson Procedure Date: 11/09/2020 3:38 PM MRN: [id number] Endoscopist: Napoleon Form , MD Age: 55 Referring MD:  Date of Birth: Jun 04, 1965 Gender: Male Account #: [id number] Procedure:                Colonoscopy Indications:              Screening for colorectal malignant neoplasm Medicines:                Monitored Anesthesia Care Procedure:                Pre-Anesthesia Assessment:                           - Prior to the procedure, a History and Physical                            was performed, and patient medications and                            allergies were reviewed. The patient's tolerance of                            previous anesthesia was also reviewed. The risks                            and benefits of the procedure and the sedation                            options and risks were discussed with the patient.                            All questions were answered, and informed consent                            was obtained. Prior Anticoagulants: The patient has                            taken no previous anticoagulant or antiplatelet                            agents. ASA Grade Assessment: II - A patient with                            mild systemic disease. After reviewing the risks                            and benefits, the patient was deemed in                            satisfactory condition to undergo the procedure.                           After obtaining informed consent, the colonoscope  was passed under direct vision. Throughout the                            procedure, the patient's blood pressure, pulse, and                            oxygen saturations were monitored continuously. The                            PCF-HQ190L Colonoscope was introduced through the                            anus and advanced to the the cecum, identified by                            appendiceal  orifice and ileocecal valve. The                            colonoscopy was performed without difficulty. The                            patient tolerated the procedure well. The quality                            of the bowel preparation was excellent. The                            ileocecal valve, appendiceal orifice, and rectum                            were photographed. Scope In: 3:49:49 PM Scope Out: 4:06:33 PM Scope Withdrawal Time: 0 hours 11 minutes 50 seconds  Total Procedure Duration: 0 hours 16 minutes 44 seconds  Findings:                 The perianal and digital rectal examinations were                            normal.                           Non-bleeding internal hemorrhoids were found during                            retroflexion. The hemorrhoids were small.                           The exam was otherwise without abnormality. Complications:            No immediate complications. Estimated Blood Loss:     Estimated blood loss: none. Impression:               - Non-bleeding internal hemorrhoids.                           - The examination was otherwise normal.                           -  No specimens collected. Recommendation:           - Patient has a contact number available for                            emergencies. The signs and symptoms of potential                            delayed complications were discussed with the                            patient. Return to normal activities tomorrow.                            Written discharge instructions were provided to the                            patient.                           - Resume previous diet.                           - Continue present medications.                           - Repeat colonoscopy in 10 years for screening                            purposes. Napoleon Form, MD 11/09/2020 4:10:42 PM This report has been signed electronically.

## 2020-11-09 NOTE — Progress Notes (Signed)
Pt. Taken to bathroom prior to d/c to allow time to pass air,abdomen soft non-tender,denies pain.

## 2020-11-09 NOTE — Progress Notes (Signed)
VS by [place]  Pt's states no medical or surgical changes since previsit or office visit.  

## 2020-11-09 NOTE — Patient Instructions (Signed)
YOU HAD AN ENDOSCOPIC PROCEDURE TODAY AT THE [facility]:   Refer to the procedure report that was given to you for any specific questions about what was found during the examination.  If the procedure report does not answer your questions, please call your gastroenterologist to clarify.  If you requested that your care partner not be given the details of your procedure findings, then the procedure report has been included in a sealed envelope for you to review at your convenience later.  YOU SHOULD EXPECT: Some feelings of bloating in the abdomen. Passage of more gas than usual.  Walking can help get rid of the air that was put into your GI tract during the procedure and reduce the bloating. If you had a lower endoscopy (such as a colonoscopy or flexible sigmoidoscopy) you may notice spotting of blood in your stool or on the toilet paper. If you underwent a bowel prep for your procedure, you may not have a normal bowel movement for a few days.  Please Note:  You might notice some irritation and congestion in your nose or some drainage.  This is from the oxygen used during your procedure.  There is no need for concern and it should clear up in a day or so.  SYMPTOMS TO REPORT IMMEDIATELY:  Following lower endoscopy (colonoscopy or flexible sigmoidoscopy):  Excessive amounts of blood in the stool  Significant tenderness or worsening of abdominal pains  Swelling of the abdomen that is new, acute  Fever of 100F or higher   For urgent or emergent issues, a gastroenterologist can be reached at any hour by calling [phone]. Do not use MyChart messaging for urgent concerns.    DIET:  We do recommend a small meal at first, but then you may proceed to your regular diet.  Drink plenty of fluids but you should avoid alcoholic beverages for 24 hours.  ACTIVITY:  You should plan to take it easy for the rest of today and you should NOT DRIVE or use heavy machinery until tomorrow (because  of the sedation medicines used during the test).    FOLLOW UP: Our staff will call the number listed on your records 48-72 hours following your procedure to check on you and address any questions or concerns that you may have regarding the information given to you following your procedure. If we do not reach you, we will leave a message.  We will attempt to reach you two times.  During this call, we will ask if you have developed any symptoms of COVID 19. If you develop any symptoms (ie: fever, flu-like symptoms, shortness of breath, cough etc.) before then, please call [phone].  If you test positive for Covid 19 in the 2 weeks post procedure, please call and report this information to us.    If any biopsies were taken you will be contacted by phone or by letter within the next 1-3 weeks.  Please call us at [phone] if you have not heard about the biopsies in 3 weeks.    SIGNATURES/CONFIDENTIALITY: You and/or your care partner have signed paperwork which will be entered into your electronic medical record.  These signatures attest to the fact that that the information above on your After Visit Summary has been reviewed and is understood.  Full responsibility of the confidentiality of this discharge information lies with you and/or your care-partner.    Resume medications. Information given on hemorrhoids. 

## 2020-11-09 NOTE — Progress Notes (Signed)
[facility] History and Physical   Primary Care Physician:  Nelwyn Salisbury, MD   Reason for Procedure:  Colorectal cancer screening  Plan:    Screening colonoscopy with possible interventions as needed     HPI: Joseph Davidson is a very pleasant 55 y.o. male here for screening colonoscopy. Denies any nausea, vomiting, abdominal pain, melena or bright red blood per rectum  The risks and benefits as well as alternatives of endoscopic procedure(s) have been discussed and reviewed. All questions answered. The patient agrees to proceed.    Past Medical History:  Diagnosis Date   ADHD (attention deficit hyperactivity disorder)    Anxiety    Chicken pox    Depression    Hyperlipidemia     Past Surgical History:  Procedure Laterality Date   benign mole     sees Dr. Elmon Else    WISDOM TOOTH EXTRACTION      Prior to Admission medications   Medication Sig Start Date End Date Taking? Authorizing Provider  ADDERALL 20 MG tablet Take 1 tablet (20 mg total) by mouth 2 (two) times daily. Patient taking differently: Take 20 mg by mouth 2 (two) times daily. As needed 02/14/20 11/09/20 Yes Nelwyn Salisbury, MD  escitalopram (LEXAPRO) 20 MG tablet Take 1 tablet (20 mg total) by mouth daily. 10/05/20  Yes Nelwyn Salisbury, MD  ketoconazole (NIZORAL) 2 % shampoo Apply topically daily. 10/19/20  Yes [provider]  Multiple Vitamin (MULTIVITAMIN) tablet Take 1 tablet by mouth daily.   Yes [provider]  ALPRAZolam (XANAX) 0.5 MG tablet Take 1 tablet (0.5 mg total) by mouth 2 (two) times daily as needed for anxiety. 09/27/20   Nelwyn Salisbury, MD  tadalafil (CIALIS) 20 MG tablet TAKE ONE TABLET BY MOUTH DAILY AS NEEDED FOR FOR ERECTILE DYSFUNCTION 11/08/20   Nelwyn Salisbury, MD  triamcinolone cream (KENALOG) 0.1 % Apply 1 application topically 3 (three) times daily. 03/11/19   Nelwyn Salisbury, MD    Current Outpatient Medications  Medication Sig Dispense Refill    ADDERALL 20 MG tablet Take 1 tablet (20 mg total) by mouth 2 (two) times daily. (Patient taking differently: Take 20 mg by mouth 2 (two) times daily. As needed) 60 tablet 0   escitalopram (LEXAPRO) 20 MG tablet Take 1 tablet (20 mg total) by mouth daily. 90 tablet 1   ketoconazole (NIZORAL) 2 % shampoo Apply topically daily.     Multiple Vitamin (MULTIVITAMIN) tablet Take 1 tablet by mouth daily.     ALPRAZolam (XANAX) 0.5 MG tablet Take 1 tablet (0.5 mg total) by mouth 2 (two) times daily as needed for anxiety. 60 tablet 5   tadalafil (CIALIS) 20 MG tablet TAKE ONE TABLET BY MOUTH DAILY AS NEEDED FOR FOR ERECTILE DYSFUNCTION 30 tablet 3   triamcinolone cream (KENALOG) 0.1 % Apply 1 application topically 3 (three) times daily. 45 g 5   Current Facility-Administered Medications  Medication Dose Route Frequency Provider Last Rate Last Admin   0.9 %  sodium chloride infusion  500 mL Intravenous Once Napoleon Form, MD        Allergies as of 11/09/2020 - Review Complete 11/09/2020  Allergen Reaction Noted   Sulfamethoxazole-trimethoprim  10/28/2008    Family History  Problem Relation Age of Onset   Heart disease Mother    Schizophrenia Other    Diabetes Other    Cancer Other        prostate   Stroke Other  Colon cancer Neg Hx    Esophageal cancer Neg Hx    Stomach cancer Neg Hx    Rectal cancer Neg Hx     Social History   Socioeconomic History   Marital status: Divorced    Spouse name: Not on file   Number of children: Not on file   Years of education: Not on file   Highest education level: Not on file  Occupational History   Not on file  Tobacco Use   Smoking status: Never   Smokeless tobacco: Never  Vaping Use   Vaping Use: Never used  Substance and Sexual Activity   Alcohol use: Yes    Alcohol/week: 1.0 standard drink    Types: 1 Standard drinks or equivalent per week    Comment: occ   Drug use: No   Sexual activity: Yes  Other Topics Concern   Not on file   Social History Narrative   Not on file   Social Determinants of Health   Financial Resource Strain: Not on file  Food Insecurity: Not on file  Transportation Needs: Not on file  Physical Activity: Not on file  Stress: Not on file  Social Connections: Not on file  Intimate Partner Violence: Not on file    Review of Systems:  All other review of systems negative except as mentioned in the HPI.  Physical Exam: Vital signs in last 24 hours: BP (!) 139/92   Pulse 92   Temp 98.9 F (37.2 C) (Skin)   Resp 11   Ht 5\' 7"  (1.702 m)   Wt 175 lb (79.4 kg)   SpO2 100%   BMI 27.41 kg/m     General:   Alert, NAD Lungs:  Clear .   Heart:  Regular rate and rhythm Abdomen:  Soft, nontender and nondistended. Neuro/Psych:  Alert and cooperative. Normal mood and affect. A and O x 3  Reviewed labs, radiology imaging, old records and pertinent past GI work up  Patient is appropriate for planned procedure(s) and anesthesia in an ambulatory setting   K. , MD [phone]

## 2020-11-11 ENCOUNTER — Telehealth: Payer: Self-pay | Admitting: *Deleted

## 2020-11-11 ENCOUNTER — Telehealth: Payer: Self-pay

## 2020-11-11 NOTE — Telephone Encounter (Signed)
Second f/u call attempt.  No answer.  Message left previously today.

## 2020-11-11 NOTE — Telephone Encounter (Signed)
First attempt follow up call to pt, no answer. 

## 2021-03-07 ENCOUNTER — Other Ambulatory Visit: Payer: Self-pay | Admitting: Family Medicine

## 2021-03-07 DIAGNOSIS — F418 Other specified anxiety disorders: Secondary | ICD-10-CM

## 2021-05-19 ENCOUNTER — Other Ambulatory Visit: Payer: Self-pay | Admitting: Family Medicine

## 2021-05-19 NOTE — Telephone Encounter (Signed)
Pt LOV was 07/05/2020 ?Last refill done on 09/27/2020 ?Please advise ?

## 2021-08-17 ENCOUNTER — Other Ambulatory Visit: Payer: Self-pay | Admitting: Family Medicine

## 2021-10-04 ENCOUNTER — Encounter: Payer: Self-pay | Admitting: Family Medicine

## 2021-10-04 ENCOUNTER — Ambulatory Visit (INDEPENDENT_AMBULATORY_CARE_PROVIDER_SITE_OTHER): Payer: Managed Care, Other (non HMO) | Admitting: Family Medicine

## 2021-10-04 VITALS — BP 110/76 | HR 75 | Temp 98.4°F | Ht 66.0 in | Wt 176.0 lb

## 2021-10-04 DIAGNOSIS — Z8249 Family history of ischemic heart disease and other diseases of the circulatory system: Secondary | ICD-10-CM | POA: Diagnosis not present

## 2021-10-04 DIAGNOSIS — F418 Other specified anxiety disorders: Secondary | ICD-10-CM

## 2021-10-04 DIAGNOSIS — Z0000 Encounter for general adult medical examination without abnormal findings: Secondary | ICD-10-CM | POA: Diagnosis not present

## 2021-10-04 LAB — CBC WITH DIFFERENTIAL/PLATELET
Basophils Absolute: 0 10*3/uL (ref 0.0–0.1)
Basophils Relative: 0.3 % (ref 0.0–3.0)
Eosinophils Absolute: 0.2 10*3/uL (ref 0.0–0.7)
Eosinophils Relative: 2.2 % (ref 0.0–5.0)
HCT: 49.1 % (ref 39.0–52.0)
Hemoglobin: 16.7 g/dL (ref 13.0–17.0)
Lymphocytes Relative: 29.3 % (ref 12.0–46.0)
Lymphs Abs: 2.2 10*3/uL (ref 0.7–4.0)
MCHC: 34 g/dL (ref 30.0–36.0)
MCV: 88.3 fl (ref 78.0–100.0)
Monocytes Absolute: 0.4 10*3/uL (ref 0.1–1.0)
Monocytes Relative: 5.5 % (ref 3.0–12.0)
Neutro Abs: 4.7 10*3/uL (ref 1.4–7.7)
Neutrophils Relative %: 62.7 % (ref 43.0–77.0)
Platelets: 205 10*3/uL (ref 150.0–400.0)
RBC: 5.56 Mil/uL (ref 4.22–5.81)
RDW: 13.2 % (ref 11.5–15.5)
WBC: 7.6 10*3/uL (ref 4.0–10.5)

## 2021-10-04 LAB — BASIC METABOLIC PANEL
BUN: 17 mg/dL (ref 6–23)
CO2: 29 mEq/L (ref 19–32)
Calcium: 10.1 mg/dL (ref 8.4–10.5)
Chloride: 102 mEq/L (ref 96–112)
Creatinine, Ser: 1.07 mg/dL (ref 0.40–1.50)
GFR: 79.02 mL/min (ref >60.00)
Glucose, Bld: 84 mg/dL (ref 70–99)
Potassium: 4.5 mEq/L (ref 3.5–5.1)
Sodium: 139 mEq/L (ref 135–145)

## 2021-10-04 LAB — TSH: TSH: 3.71 u[IU]/mL (ref 0.35–5.50)

## 2021-10-04 LAB — LIPID PANEL
Cholesterol: 227 mg/dL — ABNORMAL HIGH (ref 0–200)
HDL: 49.2 mg/dL (ref >39.00)
LDL Cholesterol: 143 mg/dL — ABNORMAL HIGH (ref 0–99)
NonHDL: 177.98
Total CHOL/HDL Ratio: 5
Triglycerides: 175 mg/dL — ABNORMAL HIGH (ref 0.0–149.0)
VLDL: 35 mg/dL (ref 0.0–40.0)

## 2021-10-04 LAB — HEPATIC FUNCTION PANEL
ALT: 25 U/L (ref 0–53)
AST: 17 U/L (ref 0–37)
Albumin: 4.4 g/dL (ref 3.5–5.2)
Alkaline Phosphatase: 72 U/L (ref 39–117)
Bilirubin, Direct: 0.1 mg/dL (ref 0.0–0.3)
Total Bilirubin: 0.6 mg/dL (ref 0.2–1.2)
Total Protein: 7.1 g/dL (ref 6.0–8.3)

## 2021-10-04 LAB — PSA: PSA: 1.34 ng/mL (ref 0.10–4.00)

## 2021-10-04 LAB — HEMOGLOBIN A1C: Hgb A1c MFr Bld: 5.8 % (ref 4.6–6.5)

## 2021-10-04 MED ORDER — SIMVASTATIN 40 MG PO TABS: 40.0000 mg | ORAL_TABLET | Freq: Every day | ORAL | 3 refills | Status: DC

## 2021-10-04 MED ORDER — ADDERALL 20 MG PO TABS: 20.0000 mg | ORAL_TABLET | Freq: Two times a day (BID) | ORAL | 0 refills | Status: DC

## 2021-10-04 MED ORDER — ESCITALOPRAM OXALATE 20 MG PO TABS: 20.0000 mg | ORAL_TABLET | Freq: Every day | ORAL | 3 refills | Status: DC

## 2021-10-04 NOTE — Progress Notes (Signed)
Subjective:    Patient ID: Joseph Davidson, male    DOB: [birth year], 56 y.o.   MRN: [id number]  HPI Here for a well exam. His main complaint today is generalized fatigue. He says he feels tired all the time. He thinks this due to his lifestyle because he works long hours at a stressful job, he eats poorly, he gets no exercise, and he does not sleep well. Otherwise no specific issues. He is interested in doing some sort of screening test for heart disease, since this runs in his family.    Review of Systems  Constitutional:  Positive for fatigue.  HENT: Negative.    Eyes: Negative.   Respiratory: Negative.    Cardiovascular: Negative.   Gastrointestinal: Negative.   Genitourinary: Negative.   Musculoskeletal: Negative.   Skin: Negative.   Neurological: Negative.   Psychiatric/Behavioral: Negative.         Objective:   Physical Exam Constitutional:      General: He is not in acute distress.    Appearance: Normal appearance. He is well-developed. He is not diaphoretic.  HENT:     Head: Normocephalic and atraumatic.     Right Ear: External ear normal.     Left Ear: External ear normal.     Nose: Nose normal.     Mouth/Throat:     Pharynx: No oropharyngeal exudate.  Eyes:     General: No scleral icterus.       Right eye: No discharge.        Left eye: No discharge.     Conjunctiva/sclera: Conjunctivae normal.     Pupils: Pupils are equal, round, and reactive to light.  Neck:     Thyroid: No thyromegaly.     Vascular: No JVD.     Trachea: No tracheal deviation.  Cardiovascular:     Rate and Rhythm: Normal rate and regular rhythm.     Heart sounds: Normal heart sounds. No murmur heard.    No friction rub. No gallop.  Pulmonary:     Effort: Pulmonary effort is normal. No respiratory distress.     Breath sounds: Normal breath sounds. No wheezing or rales.  Chest:     Chest wall: No tenderness.  Abdominal:     General: Bowel sounds are normal. There is no distension.      Palpations: Abdomen is soft. There is no mass.     Tenderness: There is no abdominal tenderness. There is no guarding or rebound.  Genitourinary:    Penis: Normal. No tenderness.      Testes: Normal.     Prostate: Normal.     Rectum: Normal. Guaiac result negative.  Musculoskeletal:        General: No tenderness. Normal range of motion.     Cervical back: Neck supple.  Lymphadenopathy:     Cervical: No cervical adenopathy.  Skin:    General: Skin is warm and dry.     Coloration: Skin is not pale.     Findings: No erythema or rash.  Neurological:     Mental Status: He is alert and oriented to person, place, and time.     Cranial Nerves: No cranial nerve deficit.     Motor: No abnormal muscle tone.     Coordination: Coordination normal.     Deep Tendon Reflexes: Reflexes are normal and symmetric. Reflexes normal.  Psychiatric:        Behavior: Behavior normal.        Thought Content: Thought  content normal.        Judgment: Judgment normal.           Assessment & Plan:  Well exam. We discussed diet and exercise. Get fasting labs. We will set him up for a cardiac CT calcium score.  Gershon Crane, MD

## 2021-10-05 ENCOUNTER — Telehealth (HOSPITAL_BASED_OUTPATIENT_CLINIC_OR_DEPARTMENT_OTHER): Payer: Self-pay

## 2021-10-10 ENCOUNTER — Ambulatory Visit (HOSPITAL_BASED_OUTPATIENT_CLINIC_OR_DEPARTMENT_OTHER)
Admission: RE | Admit: 2021-10-10 | Discharge: 2021-10-10 | Disposition: A | Discharge status: Home / Self Care | Payer: Managed Care, Other (non HMO) | Source: Ambulatory Visit | Attending: Family Medicine | Admitting: Family Medicine

## 2021-10-10 DIAGNOSIS — Z8249 Family history of ischemic heart disease and other diseases of the circulatory system: Secondary | ICD-10-CM | POA: Insufficient documentation

## 2021-10-13 ENCOUNTER — Encounter: Payer: Self-pay | Admitting: Family Medicine

## 2021-10-16 MED ORDER — AMPHETAMINE-DEXTROAMPHETAMINE 20 MG PO TABS: 20.0000 mg | ORAL_TABLET | Freq: Two times a day (BID) | ORAL | 0 refills | Status: DC

## 2021-10-16 NOTE — Telephone Encounter (Signed)
I sent in the generic

## 2021-10-18 MED ORDER — ATORVASTATIN CALCIUM 20 MG PO TABS: 20.0000 mg | ORAL_TABLET | Freq: Every day | ORAL | 3 refills | Status: DC

## 2021-10-18 NOTE — Telephone Encounter (Signed)
Done

## 2022-01-16 ENCOUNTER — Other Ambulatory Visit: Payer: Self-pay | Admitting: Family Medicine

## 2022-05-07 ENCOUNTER — Other Ambulatory Visit: Payer: Self-pay | Admitting: Family Medicine

## 2022-05-07 ENCOUNTER — Encounter: Payer: Self-pay | Admitting: Family Medicine

## 2022-05-08 ENCOUNTER — Other Ambulatory Visit: Payer: Self-pay

## 2022-05-08 ENCOUNTER — Other Ambulatory Visit: Payer: Self-pay | Admitting: Family Medicine

## 2022-05-08 MED ORDER — TADALAFIL 20 MG PO TABS: ORAL_TABLET | ORAL | 3 refills | Status: DC

## 2022-05-08 NOTE — Telephone Encounter (Signed)
Pt LOV was on 10/04/21 Last refill was done on 12/16/21 Please advise

## 2022-05-09 MED ORDER — AMPHETAMINE-DEXTROAMPHETAMINE 20 MG PO TABS: 20.0000 mg | ORAL_TABLET | Freq: Two times a day (BID) | ORAL | 0 refills | Status: DC

## 2022-05-09 NOTE — Telephone Encounter (Signed)
Done

## 2022-10-04 ENCOUNTER — Ambulatory Visit: Payer: Managed Care, Other (non HMO)

## 2022-10-04 ENCOUNTER — Ambulatory Visit: Payer: Managed Care, Other (non HMO) | Admitting: Family Medicine

## 2022-10-04 ENCOUNTER — Encounter: Payer: Self-pay | Admitting: Family Medicine

## 2022-10-04 VITALS — BP 118/80 | HR 61 | Temp 98.0°F | Wt 175.0 lb

## 2022-10-04 DIAGNOSIS — M545 Low back pain, unspecified: Secondary | ICD-10-CM

## 2022-10-04 LAB — POC URINALSYSI DIPSTICK (AUTOMATED)
Bilirubin, UA: NEGATIVE
Glucose, UA: NEGATIVE
Ketones, UA: NEGATIVE
Leukocytes, UA: NEGATIVE
Nitrite, UA: NEGATIVE
Protein, UA: POSITIVE — AB
Spec Grav, UA: 1.025 (ref 1.010–1.025)
Urobilinogen, UA: 0.2 E.U./dL
pH, UA: 5 (ref 5.0–8.0)

## 2022-10-04 MED ORDER — CYCLOBENZAPRINE HCL 10 MG PO TABS: 10.0000 mg | ORAL_TABLET | Freq: Three times a day (TID) | ORAL | 1 refills | Status: AC | PRN

## 2022-10-04 NOTE — Progress Notes (Signed)
Subjective:    Patient ID: Joseph Davidson, male    DOB: 07-15-1965, 57 y.o.   MRN: [id number]  HPI Here for left sided stiffness and pain in the lower back. This started suddenly about 12 days ago. He had been vacuuming the house, and then he went to a restaurant with his family. When he jumped down from the stool to the floor, he felt a sharp pain that caused his lower back to spasm. He could barely walk for a few hours. When he got back home he applied ice and heat, and he took Ibuprofen. Since then the pain has gradually improved, though it is still sore today. No radiation of pain to the legs.    Review of Systems  Constitutional: Negative.   Respiratory: Negative.    Cardiovascular: Negative.   Musculoskeletal:  Positive for back pain.       Objective:   Physical Exam Constitutional:      General: He is not in acute distress.    Appearance: Normal appearance.  Cardiovascular:     Rate and Rhythm: Normal rate and regular rhythm.     Pulses: Normal pulses.     Heart sounds: Normal heart sounds.  Pulmonary:     Effort: Pulmonary effort is normal.     Breath sounds: Normal breath sounds.  Musculoskeletal:     Comments: He is tender along the left side of the lower spine. He has pain on extension of the spine, but not flexion. SLR are negative.   Neurological:     Mental Status: He is alert.           Assessment & Plan:  He has some low back pain and spasm. We will get lumbar spine Xrays today. He can use Flexeril as needed. For long term care, I suggested he do exercises to strengthen his core, such as Pilates or yoga. Gershon Crane, MD

## 2022-10-04 NOTE — Addendum Note (Signed)
Addended by: Carola Rhine on: 10/04/2022 10:16 AM   Modules accepted: Orders

## 2022-10-30 ENCOUNTER — Other Ambulatory Visit: Payer: Self-pay | Admitting: Family Medicine

## 2022-10-30 NOTE — Telephone Encounter (Signed)
Pt LOV was on 10/04/22 Last refill was done 01/19/22 Please advise

## 2022-12-30 ENCOUNTER — Other Ambulatory Visit: Payer: Self-pay | Admitting: Family Medicine

## 2022-12-30 DIAGNOSIS — F418 Other specified anxiety disorders: Secondary | ICD-10-CM

## 2023-05-14 ENCOUNTER — Other Ambulatory Visit: Payer: Self-pay | Admitting: Family Medicine

## 2023-08-07 ENCOUNTER — Telehealth: Admitting: Physician Assistant

## 2023-08-07 DIAGNOSIS — T753XXA Motion sickness, initial encounter: Secondary | ICD-10-CM

## 2023-08-07 MED ORDER — SCOPOLAMINE 1 MG/3DAYS TD PT72: 1.0000 | MEDICATED_PATCH | TRANSDERMAL | 0 refills | Status: DC

## 2023-08-07 NOTE — Progress Notes (Signed)
E Visit for Motion Sickness  We are sorry that you are not feeling well. Here is how we plan to help!  Based on what you have shared with me it looks like you have symptoms of motion sickness.  I have prescribed a medication that will help prevent or alleviate your symptoms:  Scopolamine Transdermal 1 mg patch behind ear at least 4 hours prior to travel (preferably 12 hours)   Prevention:  You might feel better if you keep your eyes focused on outside while you are in motion. For example, if you are in a car, sit in the front and look in the direction you are moving; if you are on a boat, stay on the deck and look to the horizon. This helps make what you see match the movement you are feeling, and so you are less likely to feel sick.  You should also avoid reading, watching a movie, texting or reading messages, or looking at things close to you inside the vehicle you are riding in.  . Use the seat head rest. Lean your head against the back of the seat or head rest when traveling in vehicles with seats to minimize head movements.  . On a ship: When making your reservations, choose a cabin in the middle of the ship and near the waterline. When on board, go up on deck and focus on the horizon.  . In an airplane: Request a window seat and look out the window. A seat over the front edge of the wing is the most preferable spot (the degree of motion is the lowest here). Direct the air vent to blow cool air on your face.  . On a train: Always face forward and sit near a window.  . In a vehicle: Sit in the front seat; if you are the passenger, look at the scenery in the distance. For some people, driving the vehicle (rather than being a passenger) is an instant remedy.  . Avoid others who have become nauseous with motion sickness. Seeing and smelling others who have motion sickness may cause you to become sick.  GET HELP RIGHT AWAY IF:   Your symptoms do not improve or worsen within 2 days  after treatment.   You cannot keep down fluids after trying the medication.   Other associated symptoms such as severe headache, visual field changes, fever, or intractable nausea and vomiting.  MAKE SURE YOU:   Understand these instructions.  Will watch your condition.  Will get help right away if you are not doing well or get worse.  Thank you for choosing an e-visit.  Your e-visit answers were reviewed by a board certified advanced clinical practitioner to complete your personal care plan. Depending upon the condition, your plan could have included both over the counter or prescription medications.  Please review your pharmacy choice. Be sure that the pharmacy you have chosen is open so that you can pick up your prescription now.  If there is a problem you may message your provider in MyChart to have the prescription routed to another pharmacy.  Your safety is important to us. If you have drug allergies check your prescription carefully.   For the next 24 hours, you can use MyChart to ask questions about today's visit, request a non-urgent call back, or ask for a work or school excuse from your e-visit provider.  You will get an e-mail in the next two days asking about your experience. I hope that your e-visit has been   valuable and will speed your recovery.   References or for more information: https://wwwnc.cdc.gov/travel/yellowbook/2020/travel-by-air-land-sea/motion-sickness https://my.[facility].org/health/articles/12782-motion-sickness https://www.[employer]od[employer].com  

## 2023-08-07 NOTE — Progress Notes (Signed)
 I have spent 5 minutes in review of e-visit questionnaire, review and updating patient chart, medical decision making and response to patient.   Piedad Climes, PA-C

## 2024-01-05 ENCOUNTER — Other Ambulatory Visit: Payer: Self-pay | Admitting: Family Medicine

## 2024-01-05 DIAGNOSIS — F418 Other specified anxiety disorders: Secondary | ICD-10-CM

## 2024-01-23 ENCOUNTER — Encounter: Payer: Self-pay | Admitting: Family Medicine

## 2024-01-24 NOTE — Telephone Encounter (Signed)
 He will need an OV for this (we have not seen him in over a year and a half)

## 2024-01-31 ENCOUNTER — Ambulatory Visit (INDEPENDENT_AMBULATORY_CARE_PROVIDER_SITE_OTHER): Payer: Self-pay | Admitting: Family Medicine

## 2024-01-31 ENCOUNTER — Encounter: Payer: Self-pay | Admitting: Family Medicine

## 2024-01-31 VITALS — BP 134/84 | HR 73 | Temp 97.5°F | Ht 66.0 in | Wt 180.4 lb

## 2024-01-31 DIAGNOSIS — Z0000 Encounter for general adult medical examination without abnormal findings: Secondary | ICD-10-CM

## 2024-01-31 DIAGNOSIS — F418 Other specified anxiety disorders: Secondary | ICD-10-CM | POA: Diagnosis not present

## 2024-01-31 MED ORDER — ALPRAZOLAM 0.5 MG PO TABS: 0.5000 mg | ORAL_TABLET | Freq: Two times a day (BID) | ORAL | 5 refills | Status: AC | PRN

## 2024-01-31 MED ORDER — TADALAFIL 20 MG PO TABS: ORAL_TABLET | ORAL | 5 refills | Status: AC

## 2024-01-31 MED ORDER — ESCITALOPRAM OXALATE 20 MG PO TABS: 20.0000 mg | ORAL_TABLET | Freq: Every day | ORAL | 3 refills | Status: AC

## 2024-01-31 MED ORDER — AMPHETAMINE-DEXTROAMPHETAMINE 20 MG PO TABS: 20.0000 mg | ORAL_TABLET | Freq: Two times a day (BID) | ORAL | 0 refills | Status: AC

## 2024-01-31 NOTE — Progress Notes (Signed)
 "  Subjective:    Patient ID: KWALI WRINKLE, male    DOB: 28-Apr-1965, 59 y.o.   MRN: [id number]  HPI Here for a well exam. He has no concerns. At our last visit we started him on Atorvastatin  for an LDL of 143. He stopped taking this after 2 weeks however because it caused his muscles to ache and he felt weak all over. He does try to watch his diet.    Review of Systems  Constitutional: Negative.   HENT: Negative.    Eyes: Negative.   Respiratory: Negative.    Cardiovascular: Negative.   Gastrointestinal: Negative.   Genitourinary: Negative.   Musculoskeletal: Negative.   Skin: Negative.   Neurological: Negative.   Psychiatric/Behavioral: Negative.         Objective:   Physical Exam Constitutional:      General: He is not in acute distress.    Appearance: Normal appearance. He is well-developed. He is not diaphoretic.  HENT:     Head: Normocephalic and atraumatic.     Right Ear: External ear normal.     Left Ear: External ear normal.     Nose: Nose normal.     Mouth/Throat:     Pharynx: No oropharyngeal exudate.  Eyes:     General: No scleral icterus.       Right eye: No discharge.        Left eye: No discharge.     Conjunctiva/sclera: Conjunctivae normal.     Pupils: Pupils are equal, round, and reactive to light.  Neck:     Thyroid : No thyromegaly.     Vascular: No JVD.     Trachea: No tracheal deviation.  Cardiovascular:     Rate and Rhythm: Normal rate and regular rhythm.     Pulses: Normal pulses.     Heart sounds: Normal heart sounds. No murmur heard.    No friction rub. No gallop.  Pulmonary:     Effort: Pulmonary effort is normal. No respiratory distress.     Breath sounds: Normal breath sounds. No wheezing or rales.  Chest:     Chest wall: No tenderness.  Abdominal:     General: Bowel sounds are normal. There is no distension.     Palpations: Abdomen is soft. There is no mass.     Tenderness: There is no abdominal tenderness. There is no guarding or  rebound.  Genitourinary:    Penis: Normal. No tenderness.      Testes: Normal.     Prostate: Normal.     Rectum: Normal. Guaiac result negative.  Musculoskeletal:        General: No tenderness. Normal range of motion.     Cervical back: Neck supple.  Lymphadenopathy:     Cervical: No cervical adenopathy.  Skin:    General: Skin is warm and dry.     Coloration: Skin is not pale.     Findings: No erythema or rash.  Neurological:     General: No focal deficit present.     Mental Status: He is alert and oriented to person, place, and time.     Cranial Nerves: No cranial nerve deficit.     Motor: No abnormal muscle tone.     Coordination: Coordination normal.     Deep Tendon Reflexes: Reflexes are normal and symmetric. Reflexes normal.  Psychiatric:        Mood and Affect: Mood normal.        Behavior: Behavior normal.  Thought Content: Thought content normal.        Judgment: Judgment normal.           Assessment & Plan:  Well exam. We discussed diet and exercise. Get fasting labs. If his cholesterol is still high, we agreed to try a very low dose of Rosuvastatin.  Garnette Olmsted, MD    "

## 2024-02-01 LAB — BASIC METABOLIC PANEL WITH GFR
BUN: 17 mg/dL (ref 7–25)
CO2: 31 mmol/L (ref 20–32)
Calcium: 9.8 mg/dL (ref 8.6–10.3)
Chloride: 100 mmol/L (ref 98–110)
Creat: 0.91 mg/dL (ref 0.70–1.30)
Glucose, Bld: 92 mg/dL (ref 65–99)
Potassium: 5 mmol/L (ref 3.5–5.3)
Sodium: 137 mmol/L (ref 135–146)
eGFR: 99 mL/min/1.73m2

## 2024-02-01 LAB — HEPATIC FUNCTION PANEL
AG Ratio: 1.8 (calc) (ref 1.0–2.5)
ALT: 31 U/L (ref 9–46)
AST: 17 U/L (ref 10–35)
Albumin: 4.7 g/dL (ref 3.6–5.1)
Alkaline phosphatase (APISO): 71 U/L (ref 35–144)
Bilirubin, Direct: 0.1 mg/dL (ref 0.0–0.2)
Globulin: 2.6 g/dL (ref 1.9–3.7)
Indirect Bilirubin: 0.5 mg/dL (ref 0.2–1.2)
Total Bilirubin: 0.6 mg/dL (ref 0.2–1.2)
Total Protein: 7.3 g/dL (ref 6.1–8.1)

## 2024-02-01 LAB — LIPID PANEL
Cholesterol: 320 mg/dL — ABNORMAL HIGH
HDL: 48 mg/dL
LDL Cholesterol (Calc): 236 mg/dL — ABNORMAL HIGH
Non-HDL Cholesterol (Calc): 272 mg/dL — ABNORMAL HIGH
Total CHOL/HDL Ratio: 6.7 (calc) — ABNORMAL HIGH
Triglycerides: 189 mg/dL — ABNORMAL HIGH

## 2024-02-01 LAB — CBC WITH DIFFERENTIAL/PLATELET
Absolute Lymphocytes: 2087 {cells}/uL (ref 850–3900)
Absolute Monocytes: 501 {cells}/uL (ref 200–950)
Basophils Absolute: 39 {cells}/uL (ref 0–200)
Basophils Relative: 0.5 %
Eosinophils Absolute: 123 {cells}/uL (ref 15–500)
Eosinophils Relative: 1.6 %
HCT: 50.9 % (ref 39.4–51.1)
Hemoglobin: 17.2 g/dL — ABNORMAL HIGH (ref 13.2–17.1)
MCH: 29.5 pg (ref 27.0–33.0)
MCHC: 33.8 g/dL (ref 31.6–35.4)
MCV: 87.2 fL (ref 81.4–101.7)
MPV: 9.5 fL (ref 7.5–12.5)
Monocytes Relative: 6.5 %
Neutro Abs: 4951 {cells}/uL (ref 1500–7800)
Neutrophils Relative %: 64.3 %
Platelets: 248 Thousand/uL (ref 140–400)
RBC: 5.84 Million/uL — ABNORMAL HIGH (ref 4.20–5.80)
RDW: 12.8 % (ref 11.0–15.0)
Total Lymphocyte: 27.1 %
WBC: 7.7 Thousand/uL (ref 3.8–10.8)

## 2024-02-01 LAB — HEMOGLOBIN A1C
Hgb A1c MFr Bld: 5.5 %
Mean Plasma Glucose: 111 mg/dL
eAG (mmol/L): 6.2 mmol/L

## 2024-02-01 LAB — PSA: PSA: 2.92 ng/mL

## 2024-02-01 LAB — TSH: TSH: 1.78 m[IU]/L (ref 0.40–4.50)

## 2024-02-02 ENCOUNTER — Encounter: Payer: Self-pay | Admitting: Family Medicine

## 2024-02-03 ENCOUNTER — Ambulatory Visit: Payer: Self-pay | Admitting: Family Medicine

## 2024-02-03 MED ORDER — ROSUVASTATIN CALCIUM 10 MG PO TABS: 10.0000 mg | ORAL_TABLET | Freq: Every day | ORAL | 3 refills | Status: AC

## 2024-02-03 NOTE — Addendum Note (Signed)
 Addended by: JOHNNY SENIOR A on: 02/03/2024 01:05 PM   Modules accepted: Orders

## 2024-02-03 NOTE — Telephone Encounter (Signed)
 I actually prescribed 10 mg daily. We can go to every other day if he gets side effects

## 2024-02-07 ENCOUNTER — Other Ambulatory Visit: Payer: Self-pay

## 2024-02-07 DIAGNOSIS — E7800 Pure hypercholesterolemia, unspecified: Secondary | ICD-10-CM
